# Patient Record
Sex: Male | Born: 1990 | Race: White | Hispanic: No | Marital: Single | Smoking: Never smoker
Health system: Southern US, Community
[De-identification: ages and names within clinical notes are randomized; demographics above are authoritative.]

## PROBLEM LIST (undated history)

## (undated) DIAGNOSIS — L309 Dermatitis, unspecified: Secondary | ICD-10-CM

## (undated) DIAGNOSIS — F909 Attention-deficit hyperactivity disorder, unspecified type: Secondary | ICD-10-CM

## (undated) DIAGNOSIS — M7030 Other bursitis of elbow, unspecified elbow: Secondary | ICD-10-CM

## (undated) HISTORY — DX: Dermatitis, unspecified: L30.9

## (undated) HISTORY — PX: WRIST SURGERY: SHX841

## (undated) HISTORY — PX: TONSILLECTOMY: SUR1361

## (undated) HISTORY — PX: WISDOM TOOTH EXTRACTION: SHX21

---
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
## 2009-05-01 ENCOUNTER — Emergency Department (HOSPITAL_BASED_OUTPATIENT_CLINIC_OR_DEPARTMENT_OTHER): Admission: EM | Admit: 2009-05-01 | Discharge: 2009-05-01 | Payer: Self-pay | Admitting: Emergency Medicine

## 2010-02-19 ENCOUNTER — Ambulatory Visit: Payer: Self-pay | Admitting: Family Medicine

## 2010-02-19 ENCOUNTER — Inpatient Hospital Stay (HOSPITAL_COMMUNITY): Admission: EM | Admit: 2010-02-19 | Discharge: 2010-02-24 | Payer: Self-pay | Admitting: Emergency Medicine

## 2010-02-19 DIAGNOSIS — M79609 Pain in unspecified limb: Secondary | ICD-10-CM | POA: Insufficient documentation

## 2010-07-29 NOTE — Assessment & Plan Note (Signed)
Summary: RIGHT HAND PAIN (3)   Vital Signs:  Patient Profile:   20 Years Old Male CC:      Right hand pain and swelling intermittently x 2 months Height:     74 inches Weight:      152 pounds O2 Sat:      97 % O2 treatment:    Room Air Temp:     97.7 degrees F oral Pulse rate:   59 / minute Resp:     16 per minute BP sitting:   133 / 87  (left arm) Cuff size:   regular  Pt. in pain?   yes    Location:   right hand    Intensity:   9    Type:       aching  Vitals Entered By: Lajean Saver RN (February 19, 2010 8:50 AM)                   Updated Prior Medication List: No Medications Current Allergies: No known allergies History of Present Illness Chief Complaint: Right hand pain and swelling intermittently x 2 months History of Present Illness: 20 yo M here for right 3rd finger pain and swelling.  Patient states this started about 2-3 months ago.  Thinks something punctured palmar aspect of hand around that time but didn't think much of this.  Intermittently has had pain and swelling - saw Dr. Melvyn Novas, had negative x-rays and given a cortisone shot palmar hand 2 weeks ago.  Over past several days has had extreme pain that woke him up this morning, swelling, and minimal movement of 3rd finger.  No fevers.  Had redness in hand that has since gone away.  No new injuries.  Feels like getting pain up into his elbow and shoulder now too.  Was offered appt this afternoon but stated he couldn't wait that long so came in here.  REVIEW OF SYSTEMS Constitutional Symptoms      Denies fever, chills, night sweats, weight loss, weight gain, and fatigue.  Eyes       Denies change in vision, eye pain, eye discharge, glasses, contact lenses, and eye surgery. Ear/Nose/Throat/Mouth       Denies hearing loss/aids, change in hearing, ear pain, ear discharge, dizziness, frequent runny nose, frequent nose bleeds, sinus problems, sore throat, hoarseness, and tooth pain or bleeding.  Respiratory  Denies dry cough, productive cough, wheezing, shortness of breath, asthma, bronchitis, and emphysema/COPD.  Cardiovascular       Denies murmurs, chest pain, and tires easily with exhertion.    Gastrointestinal       Denies stomach pain, nausea/vomiting, diarrhea, constipation, blood in bowel movements, and indigestion. Genitourniary       Denies painful urination, kidney stones, and loss of urinary control. Neurological       Complains of numbness and tingling.      Denies paralysis, seizures, and fainting/blackouts.      Comments: right hand and wrist Musculoskeletal       Complains of muscle pain, joint pain, and redness.      Denies joint stiffness, decreased range of motion, swelling, muscle weakness, and gout.      Comments: right hand radiating up arm Skin       Denies bruising, unusual mles/lumps or sores, and hair/skin or nail changes.  Psych       Denies mood changes, temper/anger issues, anxiety/stress, speech problems, depression, and sleep problems. Other Comments: Patient c/o right hand swelling every 2 days x  2-3 months. He has been seen by Dr. Orlan Leavens who gave him a cortisone injection and was unsure of the cause of the swelling. He c/o of severe pain, tingling in his wrist, and numbness in his hand.    Past History:  Past Medical History: Multiple broken bones Possible break in the past to right knuckles  Past Surgical History: left wrist repair Tonsillectomy  Family History: none  Social History: Never Smoked Alcohol use-no Drug use-no Regular exercise-yes Smoking Status:  never Drug Use:  no Does Patient Exercise:  yes Physical Exam General appearance: well developed, well nourished, no acute distress Extremities: R hand: obvious swelling in 3rd digit extending into distal palm and MCP joint.  TTP from distal palm through 3rd finger especially palmar aspect.  Pain with passive extension of finger reproducing his pain.  No obvious cellulitis.  Minimal active  ROM. Cap refill < 3 secs distally. Assessment New Problems: HAND PAIN, RIGHT (ICD-729.5)  flexor tenosynovitis, concerning for infectious etiology with h/o cortisone shot and prior puncture wound  Plan New Orders: T-CBC w/Diff [[id number]] New Patient Level III [99203] Planning Comments:   check cbc with diff - if wbc count elevated or left shift would help to confirm infectious etiology though may be falsely normal with contained tenosynovitis.  IM toradol for pain relief.  Appointment this afternoon with [facility] for further evaluation.  Discussed they may repeat imaging, consider other imaging or go ahead with exploratory surgery given his history and exam findings.  Addendum: CBC reviewed - normal WBC count and no left shift.  Appointment made for this afternoon at 2:30pm with Dr. Orlan Leavens.   The patient and/or caregiver has been counseled thoroughly with regard to medications prescribed including dosage, schedule, interactions, rationale for use, and possible side effects and they verbalize understanding.  Diagnoses and expected course of recovery discussed and will return if not improved as expected or if the condition worsens. Patient and/or caregiver verbalized understanding.   Medication Administration  Injection # 1:    Medication: Ketorolac-Toradol 15mg     Diagnosis: HAND PAIN, RIGHT (ICD-729.5)    Route: IM    Lot #: [id number]    Mfr: Novaplus    Comments: 60mg  given    Patient tolerated injection without complications    Given by: Lajean Saver RN (February 19, 2010 9:30 AM)  Orders Added: 1)  T-CBC w/Diff [[id number]] 2)  New Patient Level III [99203]

## 2010-09-11 LAB — FUNGUS CULTURE W SMEAR: Fungal Smear: NONE SEEN

## 2010-09-11 LAB — SEDIMENTATION RATE: Sed Rate: 7 mm/hr (ref 0–16)

## 2010-09-11 LAB — GRAM STAIN

## 2010-09-11 LAB — DIFFERENTIAL
Lymphocytes Relative: 34 % (ref 12–46)
Lymphs Abs: 2.7 10*3/uL (ref 0.7–4.0)
Monocytes Relative: 10 % (ref 3–12)
Neutrophils Relative %: 54 % (ref 43–77)

## 2010-09-11 LAB — ANAEROBIC CULTURE

## 2010-09-11 LAB — CBC
Hemoglobin: 14 g/dL (ref 13.0–17.0)
Platelets: 154 10*3/uL (ref 150–400)
RBC: 4.6 MIL/uL (ref 4.22–5.81)
WBC: 7.8 10*3/uL (ref 4.0–10.5)

## 2010-09-11 LAB — CULTURE, ROUTINE-ABSCESS

## 2010-09-11 LAB — C-REACTIVE PROTEIN: CRP: 2.6 mg/dL — ABNORMAL HIGH (ref <0.6)

## 2010-11-14 NOTE — Consult Note (Signed)
[name], [name] NO.:  [id number]   MEDICAL RECORD NO.:  [id number]          PATIENT TYPE:  EMS   LOCATION:  MAJO                         FACILITY:  [facility]   PHYSICIAN:  Sandria Bales. Ezzard Standing, M.D.  DATE OF BIRTH:  12-21-1990   DATE OF CONSULTATION:  02/16/2006  DATE OF DISCHARGE:                                   CONSULTATION   HISTORY OF PRESENT ILLNESS:  This is a 21 year old male who went through  10th grade, is finishing his graduate education degree, he is planning on  this November joining the [employer].  This past Friday, August 17, he was  doing a forward flip when he did not quite make the flip correctly.  He  ended up with some right hip pain, went to an Urgent Care Center on  Saturday, August 18, came to the [facility] Emergency Room the evening of  August 18 because of worsening pain, was found to have a hematoma inside his  right iliac bone on CT scan.  The patient was sent home, still having pain,  was finally given Flexeril.  He came back Sunday evening, August 19, with  pain.  Another CT was performed which showed no change in his hematoma.  He  was sent home again.  He came back to the ER today, again, with more pain.   This time an MRI was obtained by Dr. Gray Bernhardt, who talked to me about  the patient.  In reviewing the films with Irish Lack, this hematoma runs  inside his right iliac bone, it is about 10 cm in length, and about 8 by 5.5  cm, a little bit bigger, about the same size it was when he first had CT, as  best as can be measured.  Dr. Fredia Sorrow is concerned that he may have an  iliopsoas tendon injury.  There is very little liquid component to this  hematoma, there is really nothing to evacuate.   The patient has no allergies.  He has been given Percocet, Flexeril, and  ibuprofen, for the pain and put ice packs on it.   PAST MEDICAL HISTORY:  He has no significant pulmonary, cardiac,  gastrointestinal, or urological problems.   He is here with his mother who is  at his bedside.   PHYSICAL EXAMINATION:  VITAL SIGNS:  Temperature 97.2, blood pressure 126/71, pulse 81.  GENERAL:  He is a well nourished white male, alert and cooperative.  LUNGS:  Clear to auscultation.  HEART:  Regular rate and rhythm.  ABDOMEN:  He is tender in the right lower quadrant.  He keeps his right leg  flexed at about 30 degrees.  He can straighten his leg out but it hurts a  lot.  He really has very little external evidence of an injury.   IMPRESSION:  Right iliopsoas muscle or tendon tear with surrounding  hematoma.  I do not think there is a reason to admit him to the hospital.  This is going to take a while to get over.  He is already on the correct  medicines.  I told his  mother that ibuprofen is a platelet inhibitor and I  worry about him continuing bleeding on the ibuprofen.   They have an orthopedic doctor because he has already had multiple  orthopedic injuries in the past and I suggested she see an orthopedic doctor  within a week.  He may need some rehab because he does have a little bit of  numbness over the anterior aspect of his thigh and he probably has some  possible nerve injury from stretch.  I would be very hesitant, however, to  do any kind of mechanical manipulation of his hematoma, either instrumenting  or putting needles in it for fear of causing infection.  I think they  understand this well.   Dr. Effie Shy will give her some more Flexeril, make sure she has plenty of  Percocet, they will use ice packs, he has Colace for his stools.  He knows  to drink plenty of liquids and follow up with his orthopedic doctor.      Sandria Bales. Ezzard Standing, M.D.  Electronically Signed     DHN/MEDQ  D:  02/16/2006  T:  02/16/2006  Job:  [id number]

## 2010-11-14 NOTE — Op Note (Signed)
   NAME:  [name], [name]                       ACCOUNT NO.:  [id number]   MEDICAL RECORD NO.:  [id number]                   PATIENT TYPE:  EMS   LOCATION:  MINO                                 FACILITY:  [facility]   PHYSICIAN:  Burnard Bunting, M.D.                 DATE OF BIRTH:  June 08, 1991   DATE OF PROCEDURE:  DATE OF DISCHARGE:                                 OPERATIVE REPORT   PREOPERATIVE DIAGNOSIS:  Left distal radial fracture separation.   POSTOPERATIVE DIAGNOSIS:  Left distal radial fracture separation.   OPERATION/PROCEDURE:  Left distal radius closed reduction percutaneous  pinning.   ANESTHESIA:  General endotracheal.   ESTIMATED BLOOD LOSS:  None.   DESCRIPTION OF PROCEDURE:  The patient was brought to the operating room  where a general endotracheal anesthesia was induced.  Perioperative  antibiotics were administered.  The left wrist, while under mutation was  paralyzed and the muscle relaxant at the left wrist was reduced one time.  Anatomic reduction was achieved.  The arm was then prepped with sterile prep  solution and draped in a sterile manner.  A single 0.45 K-wire was placed  through the radial styloid across the symphysis into the shaft of the distal  radius, achieving bicortical fixation just as only one reduction was  utilized for the fracture reduction, only one pass of the pin was utilized  to secure the apophysis to the symphysis.  A 0.45 K-wire was utilized.  An  11 blade was used to make a small incision which and blunt tissue dissection  was then performed down to the bone.  The reduction was confirmed in the AP  and lateral planes.  The pin was bent.  Bactroban cream was applied to the  incision site.  One 3-0 nylon suture was placed prior to applying the  Bactroban and the Xeroform.  A bulky [place] splint was applied.  The  patient tolerated the procedure well without immediate complications.  The  patient was transferred to the recovery room in  stable condition.                                                 Burnard Bunting, M.D.    GSD/MEDQ  D:  05/06/2002  T:  05/07/2002  Job:  [id number]

## 2010-11-14 NOTE — H&P (Signed)
   NAMELORENZO, [name]                       ACCOUNT NO.:  [id number]   MEDICAL RECORD NO.:  [id number]                   PATIENT TYPE:  INP   LOCATION:  1824                                 FACILITY:  [facility]   PHYSICIAN:  Burnard Bunting, M.D.                 DATE OF BIRTH:  03-14-1991   DATE OF ADMISSION:  05/06/2002  DATE OF DISCHARGE:  05/06/2002                                HISTORY & PHYSICAL   CHIEF COMPLAINT:  Left wrist pain.   HISTORY OF PRESENT ILLNESS:  The patient is a 20 year old right hand  dominant patient who fell at [facility] today.  He landed on his left  wrist.  He denies any other orthopedic complaints, denies any loss of  consciousness.   ALLERGIES:  No known drug allergies.   MEDICATIONS:  No current medications.   PAST MEDICAL HISTORY:  No real past medical history except for some  occasional exertional asthma.   FAMILY HISTORY:  Noncontributory.   PHYSICAL EXAMINATION:  GENERAL:  He is alert and oriented x3.  He is in mild  distress.  CHEST:  Clear to auscultation.  HEART:  Regular rate and rhythm.  ABDOMEN:  Exam is benign.  NECK:  There is no lymphadenopathy.  EXTREMITIES:  Extremities demonstrate a left wrist deformity with palmar  paresthesias, decreased range of motion of all fingers.  Capillary refill is  about two seconds.   LABORATORY DATA:  X-rays show Salter 2 fracture, dislocation of the left  wrist.  There is a dorsal displacement of the epiphysis.   IMPRESSION:  Left wrist epiphyseal separation.   PLAN:  Closed reduction, percutaneous pinning.  Risks and benefits of the  procedure are discussed.  Risks include infection and swelling.  There is  the possibility of growth plate disturbance from this injury.  That would  become apparent 1-2 years down the road.  He will be in an cast for six  weeks.  All questions are answered.  We will proceed with the surgery today.                                               Burnard Bunting, M.D.    GSD/MEDQ  D:  05/06/2002  T:  05/07/2002  Job:  [id number]

## 2013-03-01 ENCOUNTER — Encounter: Payer: Self-pay | Admitting: *Deleted

## 2013-03-01 ENCOUNTER — Emergency Department
Admission: EM | Admit: 2013-03-01 | Discharge: 2013-03-01 | Disposition: A | Discharge status: Home / Self Care | Payer: Self-pay | Source: Home / Self Care | Attending: Family Medicine | Admitting: Family Medicine

## 2013-03-01 DIAGNOSIS — R21 Rash and other nonspecific skin eruption: Secondary | ICD-10-CM

## 2013-03-01 DIAGNOSIS — L0100 Impetigo, unspecified: Secondary | ICD-10-CM

## 2013-03-01 MED ORDER — DOMEBORO 25 % EX PACK: PACK | CUTANEOUS | Status: DC

## 2013-03-01 MED ORDER — HYDROCODONE-ACETAMINOPHEN 5-325 MG PO TABS: ORAL_TABLET | ORAL | Status: DC

## 2013-03-01 MED ORDER — CEPHALEXIN 500 MG PO CAPS: 500.0000 mg | ORAL_CAPSULE | Freq: Three times a day (TID) | ORAL | Status: DC

## 2013-03-01 NOTE — ED Provider Notes (Signed)
CSN: [id number]     Arrival date & time 03/01/13  1114 History   First MD Initiated Contact with Patient 03/01/13 1126     Chief Complaint  Patient presents with  . Rash  . Diarrhea      HPI Comments: Patient reports that he had a poison ivy rash about two weeks ago that resolved with a steroid shot and prednisone. Four days ago he developed a small fissure behind his right ear, which he has had in the past.  This was followed by a rash beneath the fissure and soreness in right neck nodes.  He also had chills and fatigue.  He has now developed a small fissure behind his left ear that has not been painful.  He notes that he has had loose stools for 2 days, and an episode of nausea/vomiting yesterday.  Patient is a 22 y.o. male presenting with rash. The history is provided by the patient.  Rash Pain location: behind and around right ear. Pain quality: aching   Pain radiates to:  Does not radiate Pain severity:  Mild Onset quality:  Gradual Duration:  4 days Timing:  Constant Progression:  Worsening Chronicity:  New Relieved by:  Nothing Worsened by:  Nothing tried Ineffective treatments: steroid cream. Associated symptoms: chills, diarrhea, nausea and vomiting   Associated symptoms: no sore throat     History reviewed. No pertinent past medical history. History reviewed. No pertinent past surgical history. History reviewed. No pertinent family history. History  Substance Use Topics  . Smoking status: Never Smoker   . Smokeless tobacco: Current User    Types: Snuff  . Alcohol Use: Yes    Review of Systems  Constitutional: Positive for chills.  HENT: Negative for sore throat.   Gastrointestinal: Positive for nausea, vomiting and diarrhea.  Skin: Positive for rash.  All other systems reviewed and are negative.    Allergies  Poison ivy extract  Home Medications   Current Outpatient Rx  Name  Route  Sig  Dispense  Refill  . Alum Sulfate-Ca Acetate (DOMEBORO) 25 %  PACK      Dissolve in water and apply as a compress TID   12 each   1   . cephALEXin (KEFLEX) 500 MG capsule   Oral   Take 1 capsule (500 mg total) by mouth 3 (three) times daily.   30 capsule   0   . HYDROcodone-acetaminophen (NORCO/VICODIN) 5-325 MG per tablet      Take one by mouth at bedtime as needed for pain   8 tablet   0    BP 147/81  Pulse 56  Temp(Src) 97.9 F (36.6 C) (Oral)  Resp 14  Ht 6\' 1"  (1.854 m)  Wt 164 lb (74.39 kg)  BMI 21.64 kg/m2  SpO2 99% Physical Exam  Nursing note and vitals reviewed. Constitutional: He is oriented to person, place, and time. He appears well-developed and well-nourished. No distress.  HENT:  Head: Normocephalic.    Nose: Nose normal.  Mouth/Throat: Oropharynx is clear and moist.  Behind the right ear is a 1cm by 3cm moist excoriation with honey colored crust.  Beneath the right ear are a number of small 1 to 3mm satellite erythematous lesions.  No swelling or tenderness. There is a tender shotty right post-auricular node present. Behind the left ear is a minimal 1cm long shallow fissure without tenderness, drainage, or swelling.  Eyes: Conjunctivae are normal. Pupils are equal, round, and reactive to light.  Neck: Neck supple.  Cardiovascular: Normal heart sounds.   Pulmonary/Chest: Breath sounds normal.  Lymphadenopathy:    He has cervical adenopathy.  Neurological: He is alert and oriented to person, place, and time.  Skin: Skin is warm and dry.    ED Course  Procedures  none    Labs Reviewed  WOUND CULTURE       MDM   1. Rash and nonspecific skin eruption; lesions right face are somewhat suggestive of herpes zoster, but probably represent bacterial infection   2. Impetigo    Wound culture from behind right ear pending. Begin Keflex.  [place]boro compresses.  Lortab at bedtime. Followup with dermatologist if not improving.    Lattie Haw, MD 03/02/13 [phone]

## 2013-03-01 NOTE — ED Notes (Addendum)
[name] presents with rash behind both ears, more pronounced on the right side x 4 days. 2 days ago, he reports having a HA on the right side and swollen lymph nodes on the right. He vomited that day. For the past 2 days diarrhea and fatigue. About 2 weeks ago he has a widespread poison ivy, he tried putting this cream on the rash, reports it made it worse.

## 2013-03-03 ENCOUNTER — Telehealth: Payer: Self-pay | Admitting: Emergency Medicine

## 2013-03-04 ENCOUNTER — Telehealth: Payer: Self-pay | Admitting: Emergency Medicine

## 2013-03-04 LAB — WOUND CULTURE
Gram Stain: NONE SEEN
Gram Stain: NONE SEEN

## 2013-03-06 ENCOUNTER — Telehealth: Payer: Self-pay | Admitting: Emergency Medicine

## 2013-03-07 ENCOUNTER — Telehealth: Payer: Self-pay | Admitting: *Deleted

## 2015-06-13 ENCOUNTER — Other Ambulatory Visit: Payer: Self-pay | Admitting: Occupational Medicine

## 2015-06-13 ENCOUNTER — Ambulatory Visit
Admission: RE | Admit: 2015-06-13 | Discharge: 2015-06-13 | Disposition: A | Discharge status: Home / Self Care | Payer: No Typology Code available for payment source | Source: Ambulatory Visit | Attending: Occupational Medicine | Admitting: Occupational Medicine

## 2015-06-13 DIAGNOSIS — Z021 Encounter for pre-employment examination: Secondary | ICD-10-CM

## 2015-08-14 ENCOUNTER — Ambulatory Visit
Admission: RE | Admit: 2015-08-14 | Discharge: 2015-08-14 | Disposition: A | Discharge status: Home / Self Care | Payer: Worker's Compensation | Source: Ambulatory Visit | Attending: Nurse Practitioner | Admitting: Nurse Practitioner

## 2015-08-14 ENCOUNTER — Other Ambulatory Visit: Payer: Self-pay | Admitting: Nurse Practitioner

## 2015-08-14 DIAGNOSIS — M79671 Pain in right foot: Secondary | ICD-10-CM

## 2016-06-18 ENCOUNTER — Other Ambulatory Visit: Payer: Self-pay | Admitting: Nurse Practitioner

## 2016-06-18 ENCOUNTER — Ambulatory Visit
Admission: RE | Admit: 2016-06-18 | Discharge: 2016-06-18 | Disposition: A | Discharge status: Home / Self Care | Payer: Worker's Compensation | Source: Ambulatory Visit | Attending: Nurse Practitioner | Admitting: Nurse Practitioner

## 2016-06-18 DIAGNOSIS — M25512 Pain in left shoulder: Secondary | ICD-10-CM

## 2016-11-21 IMAGING — CR DG OS CALCIS 2+V*R*
2 series · 2 of 2 positions shown · non-contrast
Comparison: None in PACs

CLINICAL DATA: Five-day history of right heel pain radiating into
the arch of the foot. Symptoms are worse after exercise and upon
arising in the morning ; probable plantar fasciitis.

EXAM:
RIGHT OS CALCIS - 2+ VIEW

[x calcaneus axial right]
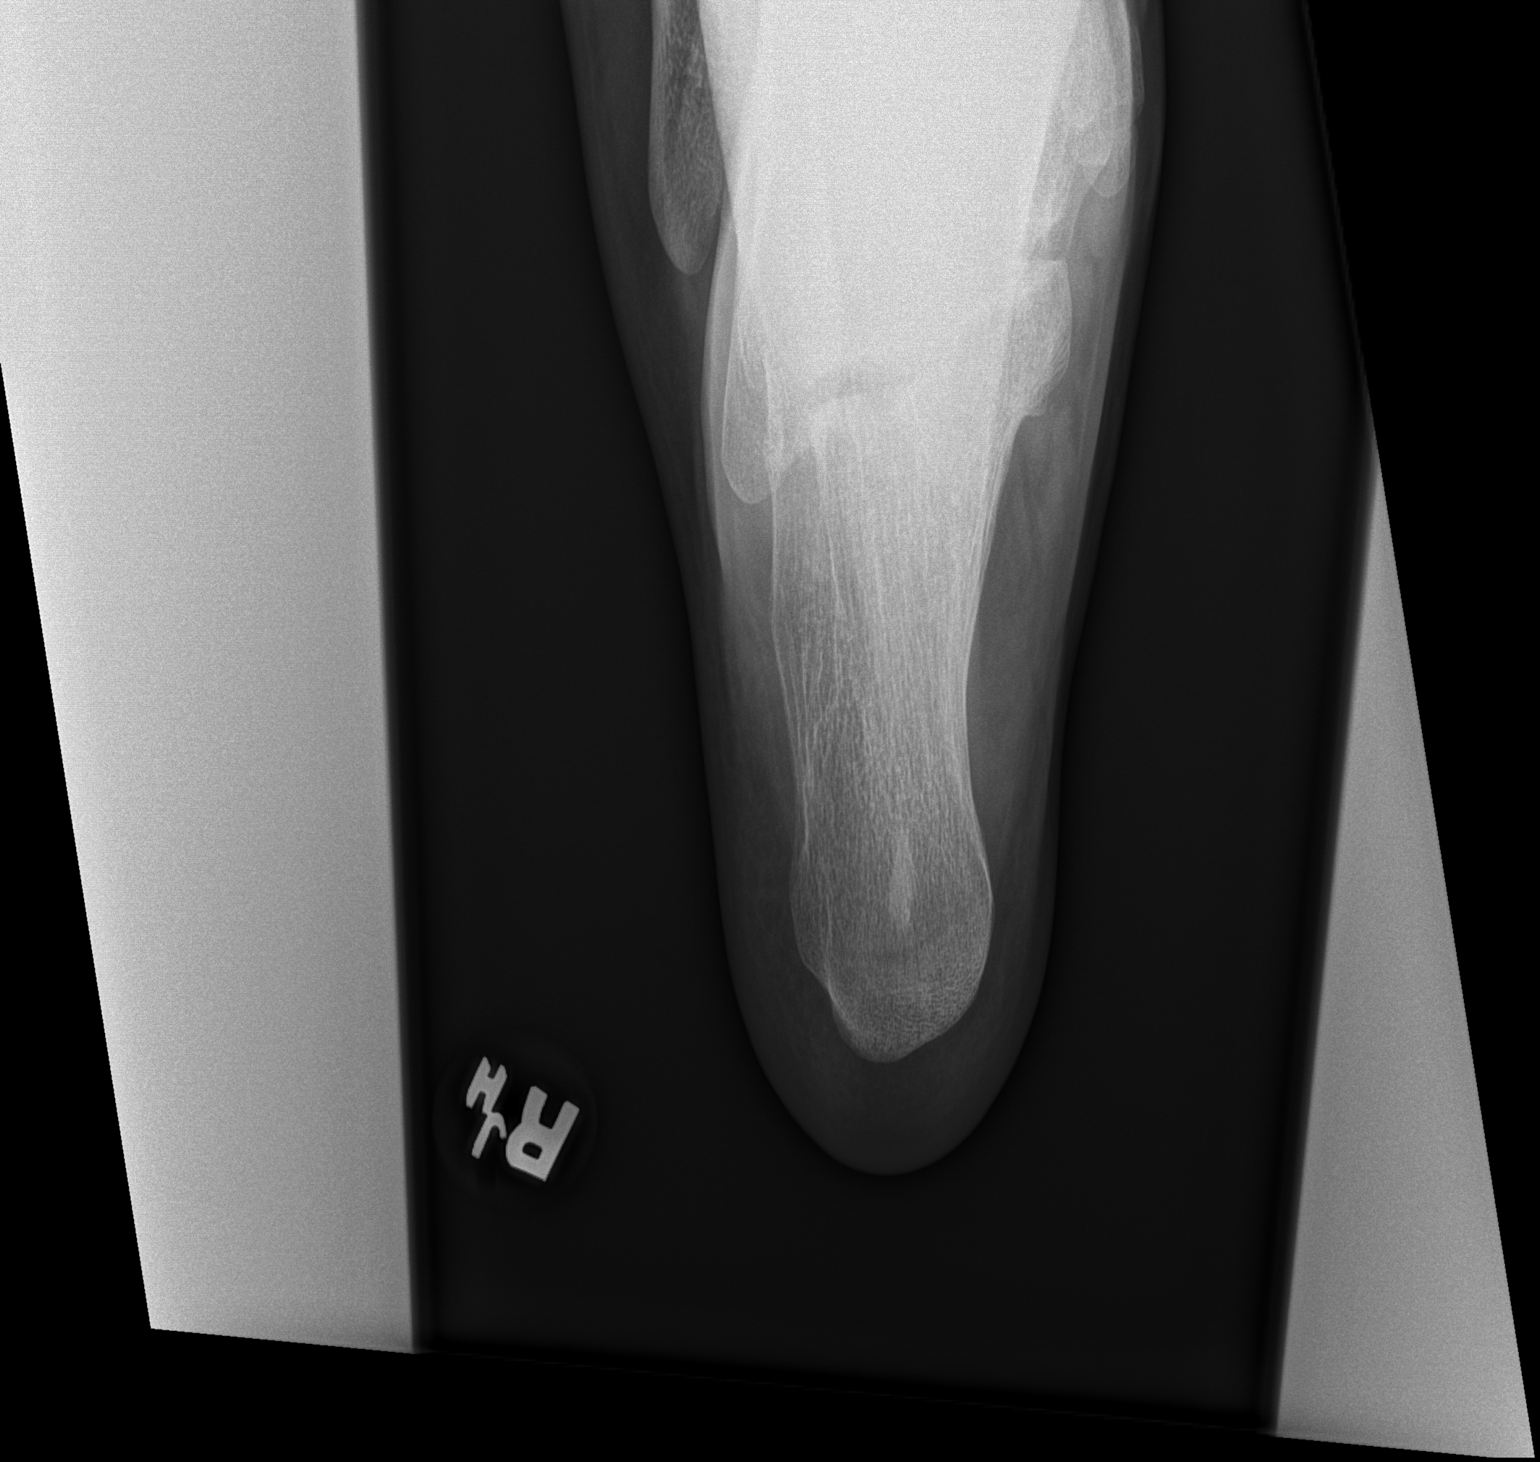

[x calcaneus lat right]
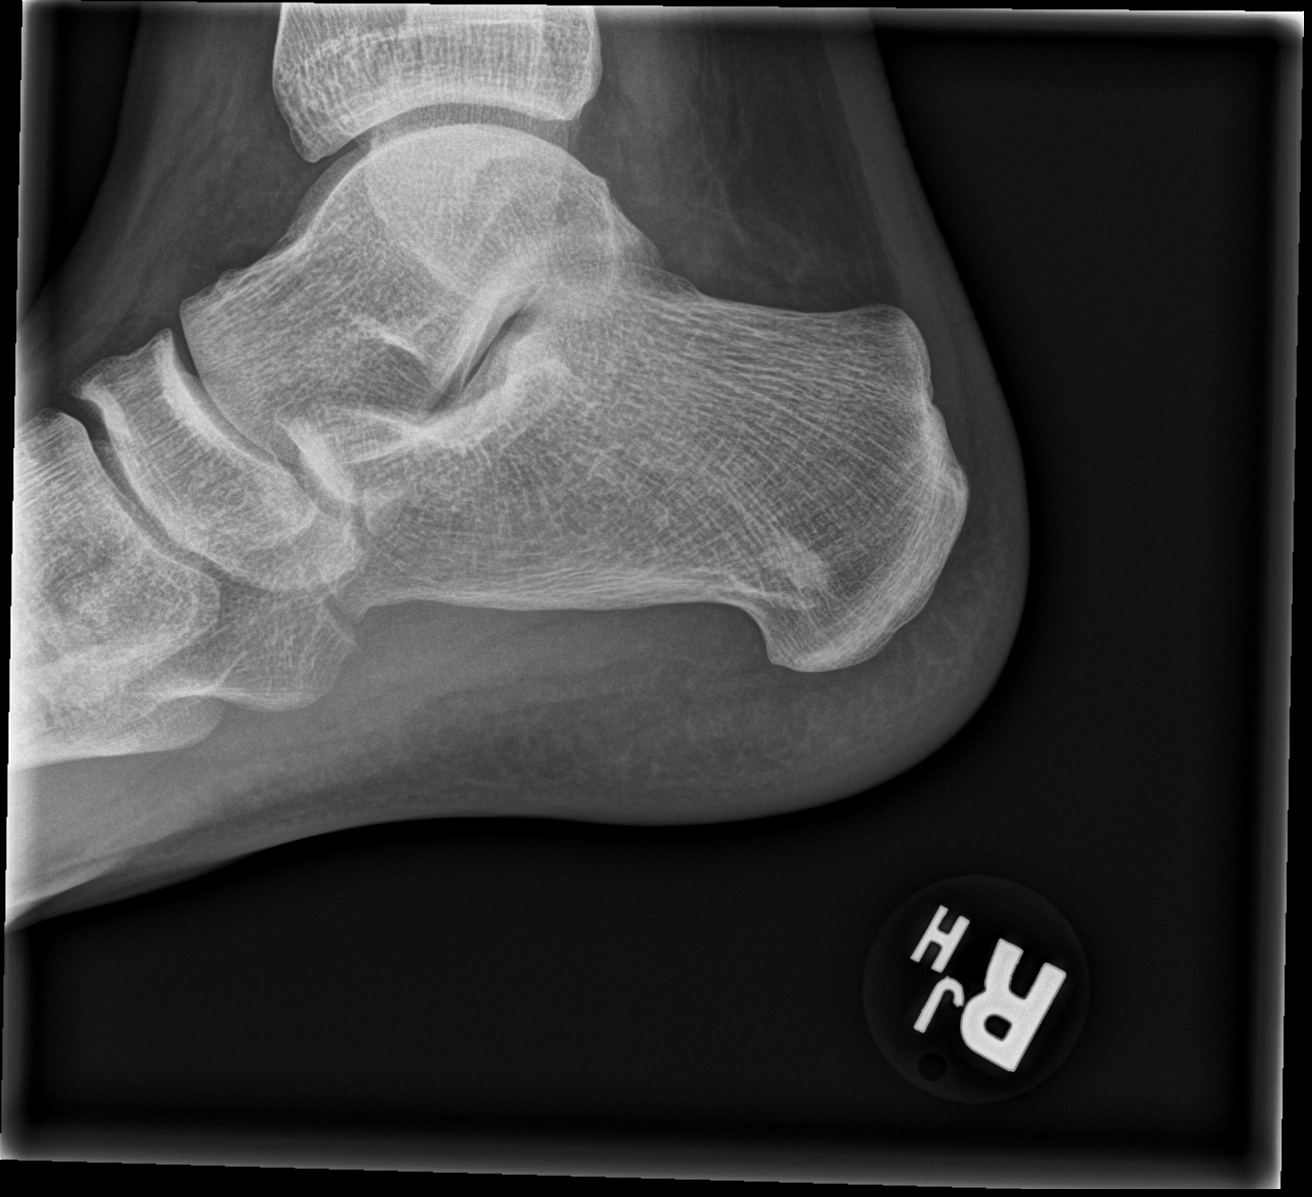

[2 of 2 positions shown; findings below may reference images not displayed]

FINDINGS: The calcaneus appears normal in contour. There is no acute or
healing fracture. There is no lytic lesion. A sclerotic focus
posteriorly and inferiorly is most compatible with a bone island.
There is no significant calcaneal spurring. The talus and other
visualized hindfoot bones appear normal. The soft tissues are
unremarkable.
IMPRESSION: There is no acute bony abnormality of the right calcaneus. No
significant spurring is observed.

## 2017-07-15 ENCOUNTER — Encounter: Payer: Self-pay | Admitting: Allergy and Immunology

## 2017-07-15 ENCOUNTER — Ambulatory Visit: Payer: 59 | Admitting: Allergy and Immunology

## 2017-07-15 VITALS — BP 140/70 | HR 98 | Temp 98.2°F | Resp 18 | Ht 71.1 in | Wt 164.8 lb

## 2017-07-15 DIAGNOSIS — L209 Atopic dermatitis, unspecified: Secondary | ICD-10-CM | POA: Insufficient documentation

## 2017-07-15 DIAGNOSIS — J310 Chronic rhinitis: Secondary | ICD-10-CM | POA: Diagnosis not present

## 2017-07-15 DIAGNOSIS — L2089 Other atopic dermatitis: Secondary | ICD-10-CM | POA: Diagnosis not present

## 2017-07-15 MED ORDER — MOMETASONE FUROATE 0.1 % EX OINT: TOPICAL_OINTMENT | CUTANEOUS | 3 refills | Status: DC

## 2017-07-15 MED ORDER — CRISABOROLE 2 % EX OINT: 1.0000 "application " | TOPICAL_OINTMENT | Freq: Two times a day (BID) | CUTANEOUS | 5 refills | Status: DC | PRN

## 2017-07-15 MED ORDER — FLUTICASONE PROPIONATE 50 MCG/ACT NA SUSP: 1.0000 | Freq: Every day | NASAL | 5 refills | Status: DC

## 2017-07-15 MED ORDER — DESONIDE 0.05 % EX OINT: TOPICAL_OINTMENT | CUTANEOUS | 3 refills | Status: AC

## 2017-07-15 NOTE — Patient Instructions (Addendum)
Atopic dermatitis  Appropriate skin care recommendations have been provided verbally and in written form.  Prescription has been provided for Eucrisa (crisaborole) 2% ointment twice a day to affected areas as needed.  A prescription has been provided for desonide 0.05% ointment sparingly to affected areas twice daily as needed to the face and/or neck. Care is to be taken to avoid the eyes.  A prescription has been provided for mometasone 0.1% ointment sparingly to affected areas daily as needed below the face and neck. Care is to be taken to avoid the axillae and groin area.  The patient has been asked to make note of any foods that trigger symptom flares.  Fingernails are to be kept trimmed.  Information has been provided regarding CLn BodyWash to reduce staph aureus colonization.  CLn BodyWash is ordered online however, if it is too expensive, information and instructions for diluted bleach baths have also been provided.  Information has been provided and discussed for Dupixent. The patient is interested in initiating this therapy. Paperwork will be submitted to insurance and a sample will be provided.  Chronic rhinitis All seasonal and perennial aeroallergen skin tests are negative despite a positive histamine control.  Intranasal steroids, intranasal antihistamines, and first generation antihistamines are effective for symptoms associated with non-allergic rhinitis, whereas second generation antihistamines such as cetirizine (Zyrtec), loratadine (Claritin) and fexofenadine (Allegra) have been found to be ineffective for this condition.  A prescription has been provided for fluticasone nasal spray, one spray per nostril 1-2 times daily as needed. Proper nasal spray technique has been discussed and demonstrated.  Nasal saline lavage (NeilMed) has been recommended as needed and prior to medicated nasal sprays along with instructions for proper administration.  For thick post nasal drainage,  add guaifenesin 1200 mg (Mucinex Maximum Strength)  twice daily as needed with adequate hydration as discussed.   Return in about 3 months (around 10/13/2017), or if symptoms worsen or fail to improve.  ECZEMA SKIN CARE REGIMEN:  Bathed and soak for 10 minutes in warm water once today. Pat dry.  Immediately apply the below creams: To healthy skin apply Aquaphor or Vaseline jelly twice a day. To affected areas on the face and neck, apply: . Eucrisa (crisaborole) 2% ointment twice a day to affected areas as needed for mild areas. . Desonide 0.05% ointment twice a day as needed for stubborn areas. . Be careful to avoid the eyes. To affected areas on the body (below the face and neck), apply: . Eucrisa (crisaborole) 2% ointment twice a day to affected areas as needed for mild areas. . Mometasone 0.1% ointment once a day as needed for stubborn areas. . With ointments be careful to avoid the armpits and groin area. Note of any foods make the eczema worse. Keep finger nails trimmed and filed.  CLn BodyWash may be ordered online at [email or web address]  If CLn BodyWash is too expensive, may try diluted bleach baths...  Diluted bleach bath recipe and instructions:   Add  -  cup of common household bleach to a bathtub full of water.  Soak the affected part of the body (below the head and neck) for about 10 minutes.  Limit diluted bleach baths to no more than twice a week.   Do not submerge the head or face and be very careful to avoid getting the diluted bleach into the eyes.   Rinse off with fresh water and apply moisturizer.

## 2017-07-15 NOTE — Assessment & Plan Note (Signed)
All seasonal and perennial aeroallergen skin tests are negative despite a positive histamine control.  Intranasal steroids, intranasal antihistamines, and first generation antihistamines are effective for symptoms associated with non-allergic rhinitis, whereas second generation antihistamines such as cetirizine (Zyrtec), loratadine (Claritin) and fexofenadine (Allegra) have been found to be ineffective for this condition.  A prescription has been provided for fluticasone nasal spray, one spray per nostril 1-2 times daily as needed. Proper nasal spray technique has been discussed and demonstrated.  Nasal saline lavage (NeilMed) has been recommended as needed and prior to medicated nasal sprays along with instructions for proper administration.  For thick post nasal drainage, add guaifenesin 1200 mg (Mucinex Maximum Strength)  twice daily as needed with adequate hydration as discussed.

## 2017-07-15 NOTE — Progress Notes (Signed)
New Patient Note  RE: [name] MRN: [id number] DOB: January 13, 1991 Date of Office Visit: 07/15/2017  Referring provider: No ref. provider found Primary care provider: Patient, No Pcp Per  Chief Complaint: Eczema   History of present illness: [name] is a 27 y.o. male presenting today for evaluation of eczema. He reports that he has had eczema since infancy.  The eczema primarily involves his popliteal fossae, buttocks, antecubital fossae, wrists, chest, back, neck, face, and eyelids.  No specific food or environmental triggers have been identified which seem to correlate with eczema flares.  However, he does note that he eats soy based products frequently and is curious if this food may be triggering his eczema flares.  He has seen a dermatologist in the past with prescriptions for fluocinonide and fluticasone cream, but has had difficulty getting refill prescriptions, therefore he has decided to seek allergy evaluation.  He experiences nasal congestion and thick postnasal drainage, particularly in cold weather.  No other specific triggers have been identified related to his nasal symptoms other than upper respiratory tract infections.   Assessment and plan: Atopic dermatitis  Appropriate skin care recommendations have been provided verbally and in written form.  Prescription has been provided for Eucrisa (crisaborole) 2% ointment twice a day to affected areas as needed.  A prescription has been provided for desonide 0.05% ointment sparingly to affected areas twice daily as needed to the face and/or neck. Care is to be taken to avoid the eyes.  A prescription has been provided for mometasone 0.1% ointment sparingly to affected areas daily as needed below the face and neck. Care is to be taken to avoid the axillae and groin area.  The patient has been asked to make note of any foods that trigger symptom flares.  Fingernails are to be kept trimmed.  Information has been  provided regarding CLn BodyWash to reduce staph aureus colonization.  CLn BodyWash is ordered online however, if it is too expensive, information and instructions for diluted bleach baths have also been provided.  Information has been provided and discussed for Dupixent. The patient is interested in initiating this therapy. Paperwork will be submitted to insurance and a sample will be provided.  Chronic rhinitis All seasonal and perennial aeroallergen skin tests are negative despite a positive histamine control.  Intranasal steroids, intranasal antihistamines, and first generation antihistamines are effective for symptoms associated with non-allergic rhinitis, whereas second generation antihistamines such as cetirizine (Zyrtec), loratadine (Claritin) and fexofenadine (Allegra) have been found to be ineffective for this condition.  A prescription has been provided for fluticasone nasal spray, one spray per nostril 1-2 times daily as needed. Proper nasal spray technique has been discussed and demonstrated.  Nasal saline lavage (NeilMed) has been recommended as needed and prior to medicated nasal sprays along with instructions for proper administration.  For thick post nasal drainage, add guaifenesin 1200 mg (Mucinex Maximum Strength)  twice daily as needed with adequate hydration as discussed.   Meds ordered this encounter  Medications  . Crisaborole (EUCRISA) 2 % OINT    Sig: Apply 1 application topically 2 (two) times daily as needed.    Dispense:  60 g    Refill:  5  . desonide (DESOWEN) 0.05 % ointment    Sig: Apply to affected areas twice a day as needed to the face and neck avoiding the eyes    Dispense:  60 g    Refill:  3  . mometasone (ELOCON) 0.1 % ointment  Sig: Apply to affected areas daily as needed below the neck avoiding th under arms and groin    Dispense:  45 g    Refill:  3  . fluticasone (FLONASE) 50 MCG/ACT nasal spray    Sig: Place 1 spray into both nostrils daily.      Dispense:  18.2 g    Refill:  5    Diagnostics: Environmental skin testing: Negative despite a positive histamine control. Food allergen skin testing: Negative despite a positive histamine control.    Physical examination: Blood pressure 140/70, pulse 98, temperature 98.2 F (36.8 C), temperature source Oral, resp. rate 18, height 5' 11.1" (1.806 m), weight 164 lb 12.8 oz (74.8 kg), SpO2 97 %.  General: Alert, interactive, in no acute distress. HEENT: TMs pearly gray, turbinates moderately edematous with thick discharge, post-pharynx markedly erythematous. Neck: Supple without lymphadenopathy. Lungs: Clear to auscultation without wheezing, rhonchi or rales. CV: Normal S1, S2 without murmurs. Abdomen: Nondistended, nontender. Skin: Dry, erythematous, excoriated patches on the antecubital fossae, wrists, and popliteal fossae. Extremities:  No clubbing, cyanosis or edema. Neuro:   Grossly intact.  Review of systems:  Review of systems negative except as noted in HPI / PMHx or noted below: Review of Systems  Constitutional: Negative.   HENT: Negative.   Eyes: Negative.   Respiratory: Negative.   Cardiovascular: Negative.   Gastrointestinal: Negative.   Genitourinary: Negative.   Musculoskeletal: Negative.   Skin: Negative.   Neurological: Negative.   Endo/Heme/Allergies: Negative.   Psychiatric/Behavioral: Negative.     Past medical history:  Past Medical History:  Diagnosis Date  . Eczema     Past surgical history:  Past Surgical History:  Procedure Laterality Date  . TONSILLECTOMY    . WISDOM TOOTH EXTRACTION    . WRIST SURGERY      Family history: Family History  Problem Relation Age of Onset  . Allergic rhinitis Neg Hx   . Angioedema Neg Hx   . Asthma Neg Hx   . Eczema Neg Hx   . Immunodeficiency Neg Hx   . Urticaria Neg Hx     Social history: Social History   Socioeconomic History  . Marital status: Single    Spouse name: Not on file  .  Number of children: Not on file  . Years of education: Not on file  . Highest education level: Not on file  Social Needs  . Financial resource strain: Not on file  . Food insecurity - worry: Not on file  . Food insecurity - inability: Not on file  . Transportation needs - medical: Not on file  . Transportation needs - non-medical: Not on file  Occupational History  . Not on file  Tobacco Use  . Smoking status: Never Smoker  . Smokeless tobacco: Current User    Types: Snuff  Substance and Sexual Activity  . Alcohol use: Yes  . Drug use: No  . Sexual activity: Not on file  Other Topics Concern  . Not on file  Social History Narrative  . Not on file   Environmental History: The patient lives in a 29 year old house with carpeting in the bedroom and central air/heat.  There is no known mold/water damage in the home.  There are 4 dogs and 1 cat in the home which have access to his bedroom.  He is a non-smoker.  Allergies as of 07/15/2017      Reactions   Poison Ivy Extract [poison Ivy Extract]  Medication List        Accurate as of 07/15/17  3:18 PM. Always use your most recent med list.          amphetamine-dextroamphetamine 20 MG 24 hr capsule Commonly known as:  ADDERALL XR Take 20 mg by mouth daily.   Crisaborole 2 % Oint Commonly known as:  EUCRISA Apply 1 application topically 2 (two) times daily as needed.   desonide 0.05 % ointment Commonly known as:  DESOWEN Apply to affected areas twice a day as needed to the face and neck avoiding the eyes   fluocinonide cream 0.05 % Commonly known as:  LIDEX Apply 1 application topically 2 (two) times daily.   fluticasone 0.05 % cream Commonly known as:  CUTIVATE Apply 1 application topically 2 (two) times daily.   fluticasone 50 MCG/ACT nasal spray Commonly known as:  FLONASE Place 1 spray into both nostrils daily.   mometasone 0.1 % ointment Commonly known as:  ELOCON Apply to affected areas daily as needed  below the neck avoiding th under arms and groin       Known medication allergies: Allergies  Allergen Reactions  . Poison Ivy Extract [Poison Ivy Extract]     I appreciate the opportunity to take part in Rashawd's care. Please do not hesitate to contact me with questions.  Sincerely,   R. Jorene Guest, MD

## 2017-07-15 NOTE — Assessment & Plan Note (Addendum)
   Appropriate skin care recommendations have been provided verbally and in written form.  Prescription has been provided for Eucrisa (crisaborole) 2% ointment twice a day to affected areas as needed.  A prescription has been provided for desonide 0.05% ointment sparingly to affected areas twice daily as needed to the face and/or neck. Care is to be taken to avoid the eyes.  A prescription has been provided for mometasone 0.1% ointment sparingly to affected areas daily as needed below the face and neck. Care is to be taken to avoid the axillae and groin area.  The patient has been asked to make note of any foods that trigger symptom flares.  Fingernails are to be kept trimmed.  Information has been provided regarding CLn BodyWash to reduce staph aureus colonization.  CLn BodyWash is ordered online however, if it is too expensive, information and instructions for diluted bleach baths have also been provided.  Information has been provided and discussed for Dupixent. The patient is interested in initiating this therapy. Paperwork will be submitted to insurance and a sample will be provided.

## 2017-08-31 ENCOUNTER — Ambulatory Visit (INDEPENDENT_AMBULATORY_CARE_PROVIDER_SITE_OTHER): Payer: 59

## 2017-08-31 ENCOUNTER — Other Ambulatory Visit: Payer: Self-pay

## 2017-08-31 DIAGNOSIS — L209 Atopic dermatitis, unspecified: Secondary | ICD-10-CM

## 2017-08-31 MED ORDER — EPINEPHRINE 0.3 MG/0.3ML IJ SOAJ: 0.3000 mg | Freq: Once | INTRAMUSCULAR | 1 refills | Status: AC

## 2017-08-31 NOTE — Progress Notes (Signed)
Immunotherapy   Patient Details  Name: [name] MRN: [id number] Date of Birth: 21-Jun-1991  08/31/2017  [name] started injections for Dupixent 600mg  Both doses admin in office.  Pt to come back in 14 days to self admin. Lot#[id number] 4-20. Following schedule: every 14 days  Frequency:every 14 days Epi-Pen:Epi-Pen Available  Consent signed and patient instructions given.   [name] 08/31/2017, 2:55 PM

## 2017-08-31 NOTE — Progress Notes (Deleted)
Pt here for first administration of Dupixent 600mg .  First dose admin by office staff then second dose to be admin by pt.  Pt will then admin at home only one syringe every 14 days. Consent signed.  Epipen RX today.  Lot# [id number] 4-20.

## 2017-09-14 ENCOUNTER — Ambulatory Visit: Payer: 59

## 2017-09-26 IMAGING — CR DG SHOULDER 2+V*L*
3 series · 3 of 3 positions shown · non-contrast
Comparison: None.

CLINICAL DATA: Left shoulder injury 3 weeks ago

EXAM:
LEFT SHOULDER - 2+ VIEW

[w shoulder grashey left]
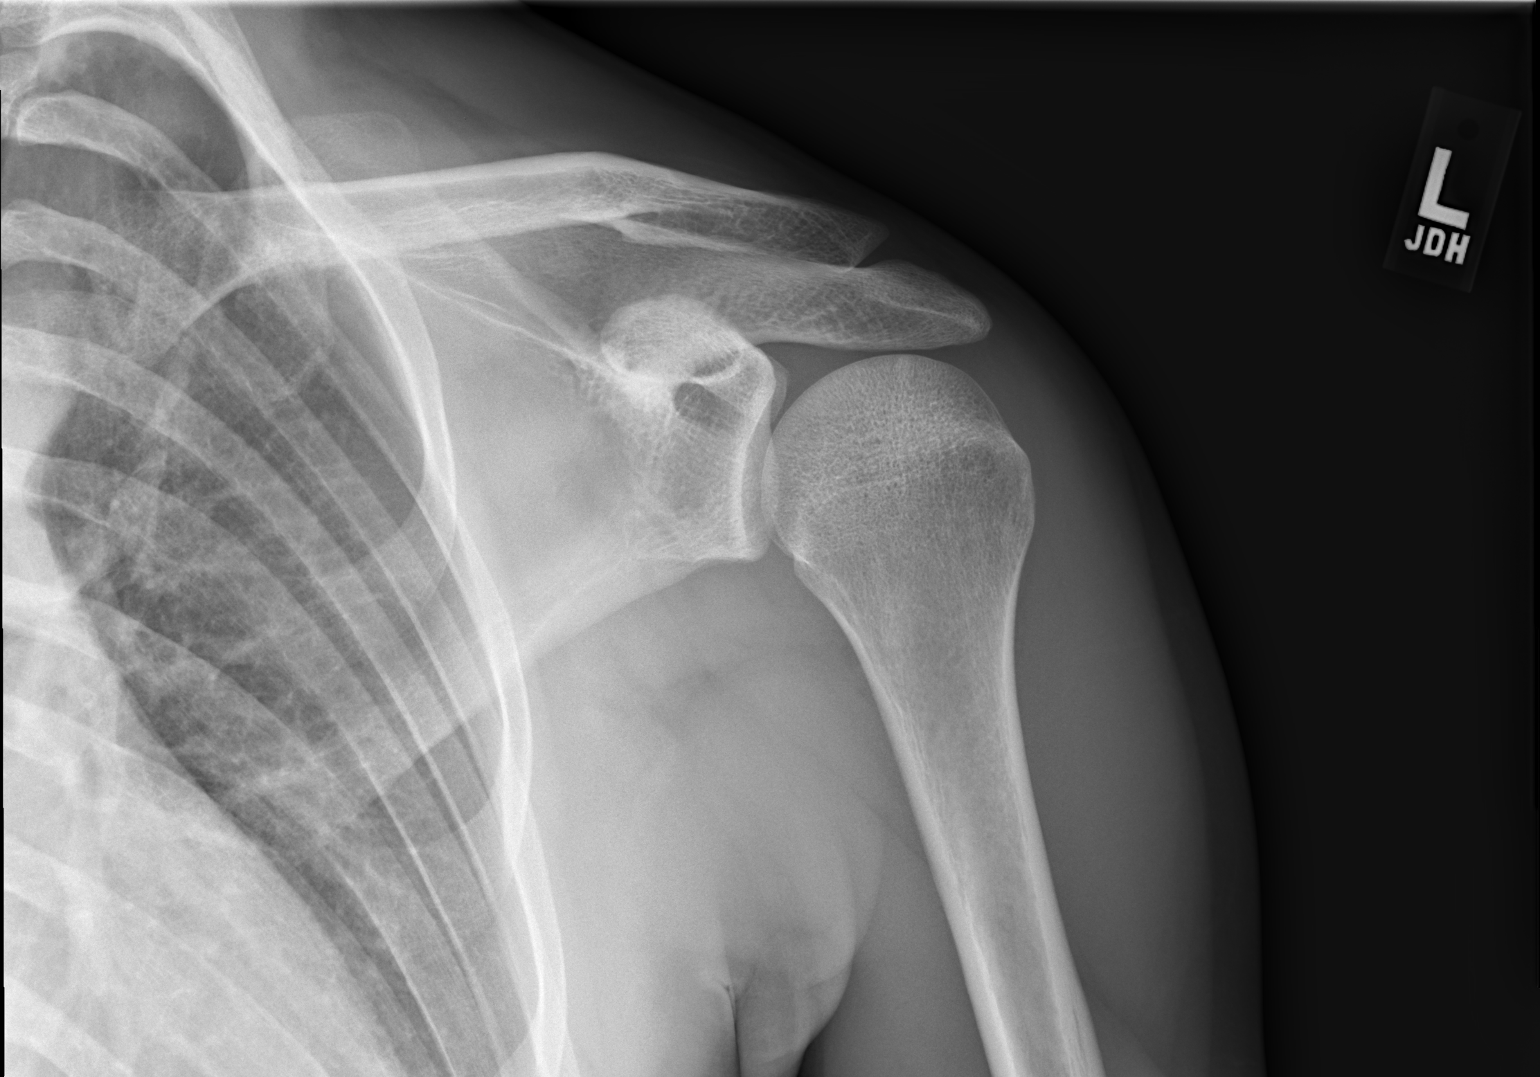

[w shoulder y-view left]
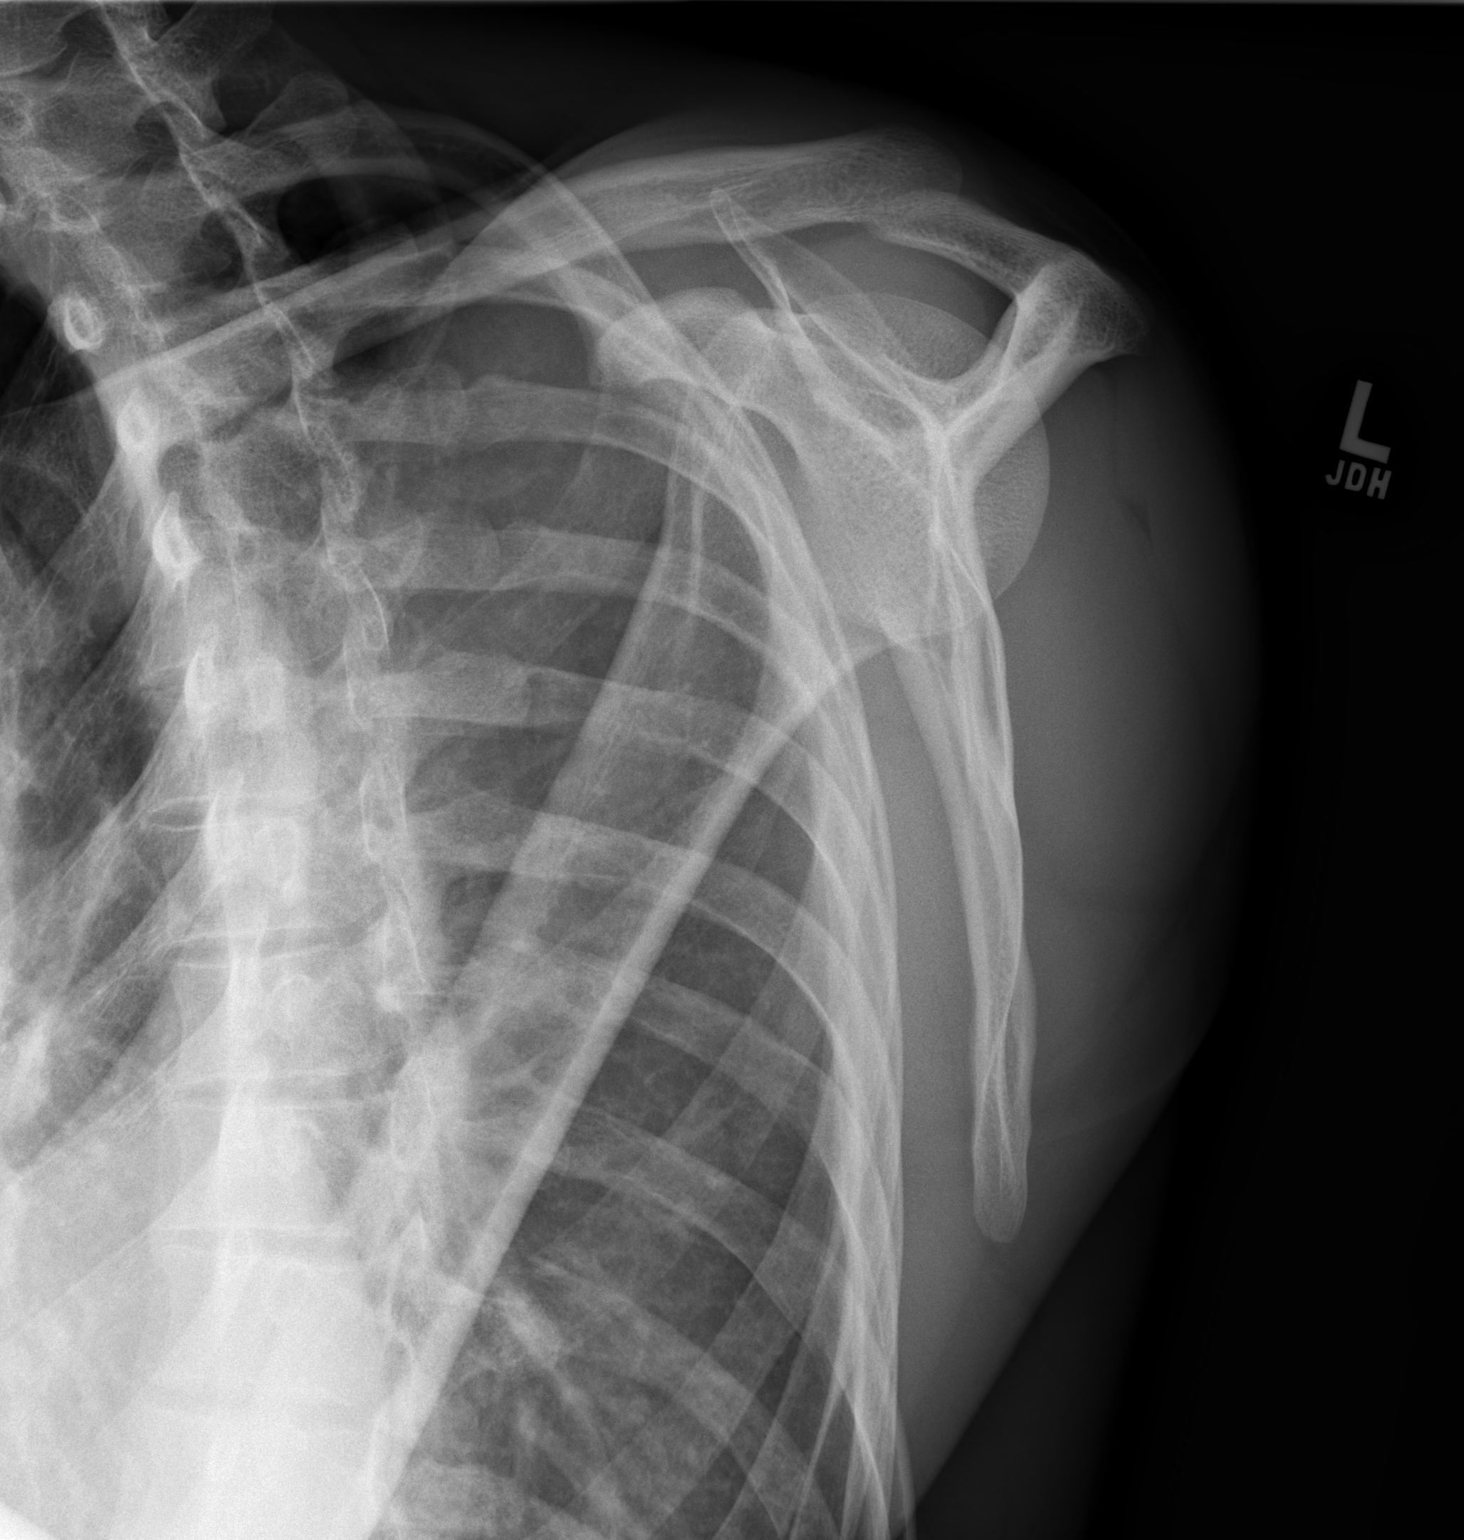

[w shoulder axillary left]
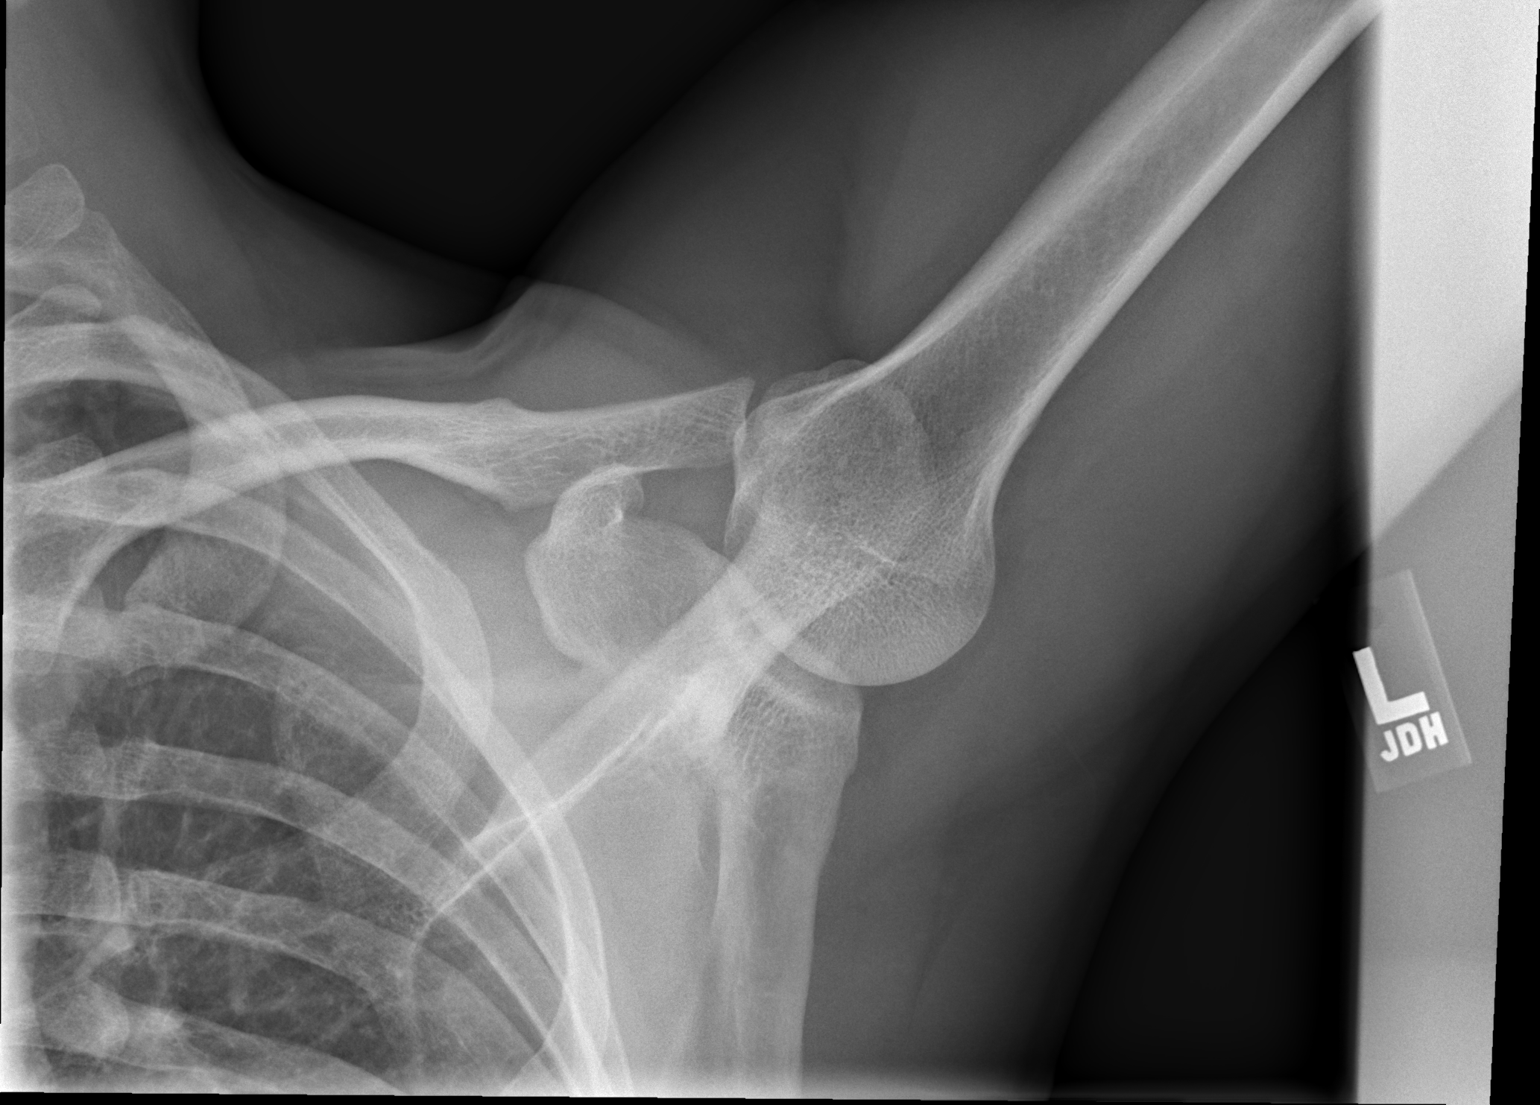

[3 of 3 positions shown; findings below may reference images not displayed]

FINDINGS: There is no evidence of fracture or dislocation. There is no
evidence of arthropathy or other focal bone abnormality. Soft
tissues are unremarkable.
IMPRESSION: Negative.

## 2017-10-14 ENCOUNTER — Ambulatory Visit: Payer: 59 | Admitting: Allergy and Immunology

## 2017-10-14 DIAGNOSIS — J309 Allergic rhinitis, unspecified: Secondary | ICD-10-CM

## 2019-02-22 ENCOUNTER — Ambulatory Visit: Payer: 59 | Admitting: Allergy and Immunology

## 2019-02-22 ENCOUNTER — Encounter: Payer: Self-pay | Admitting: Allergy and Immunology

## 2019-02-22 ENCOUNTER — Other Ambulatory Visit: Payer: Self-pay

## 2019-02-22 DIAGNOSIS — L2089 Other atopic dermatitis: Secondary | ICD-10-CM

## 2019-02-22 DIAGNOSIS — L209 Atopic dermatitis, unspecified: Secondary | ICD-10-CM | POA: Diagnosis not present

## 2019-02-22 DIAGNOSIS — J310 Chronic rhinitis: Secondary | ICD-10-CM

## 2019-02-22 MED ORDER — MOMETASONE FUROATE 0.1 % EX OINT: TOPICAL_OINTMENT | CUTANEOUS | 3 refills | Status: AC

## 2019-02-22 MED ORDER — DUPILUMAB 300 MG/2ML ~~LOC~~ SOSY
300.0000 mg | PREFILLED_SYRINGE | Freq: Once | SUBCUTANEOUS | Status: AC
  Administered 2019-02-22: 12:00:00 300 mg via SUBCUTANEOUS

## 2019-02-22 MED ORDER — EUCRISA 2 % EX OINT: 1.0000 "application " | TOPICAL_OINTMENT | Freq: Two times a day (BID) | CUTANEOUS | 5 refills | Status: AC | PRN

## 2019-02-22 MED ORDER — FLUTICASONE PROPIONATE 50 MCG/ACT NA SUSP: 1.0000 | Freq: Every day | NASAL | 5 refills | Status: DC

## 2019-02-22 NOTE — Patient Instructions (Addendum)
Atopic dermatitis Poorly controlled.  Restart Dupixent injections.  To hasten symptom relief, prednisone has been provided, 40 mg x3 days, 20 mg x1 day, 10 mg x1 day, then stop.  A prescription has been provided for mometasone 0.1% ointment sparingly to affected areas as needed.  This medication is not to be used on the face, neck, axillae, or groin area.  A prescription has been provided for Eucrisa (crisaborole) 2% ointment twice a day to affected areas as needed.  I have recommended  CLn body wash to decrease staph aureus colonization.  This body wash may be ordered online.  Chronic rhinitis  Continue fluticasone nasal spray if needed.  Nasal saline spray (i.e., Simply Saline) or nasal saline lavage (i.e., NeilMed) is recommended as needed and prior to medicated nasal sprays.  For thick post nasal drainage, add guaifenesin [phone] mg (Mucinex)  twice daily as needed with adequate hydration as discussed.   Return in about 6 months (around 08/25/2019), or if symptoms worsen or fail to improve.

## 2019-02-22 NOTE — Progress Notes (Signed)
Follow-up Note  RE: [name] MRN: [id number] DOB: 09/16/1990 Date of Office Visit: 02/22/2019  Primary care provider: Patient, No Pcp Per Referring provider: No ref. provider found  History of present illness: [name] is a 28 y.o. male with atopic dermatitis and chronic rhinitis presenting today for follow-up.  He was previously seen in this clinic for his initial evaluation in January 2019.  He reports that he had been receiving Dupixent injections for approximately 1 year.  He states that the Dupixent "was working some" regarding eczema suppression.  However, after 1 year of taking these injections he was no longer able to receive the injections for free and could not afford the medication therefore he discontinued.  His eczema has returned with severe involvement of the antecubital fossae, popliteal fossae, abdomen, and chest.  He received a steroid injection in an attempt to control the eczema.  He currently has insurance and would like to restart Dupixent, if possible.  He has no nasal allergy symptom complaints today.  Assessment and plan: Atopic dermatitis Poorly controlled.  Restart Dupixent injections.  To hasten symptom relief, prednisone has been provided, 40 mg x3 days, 20 mg x1 day, 10 mg x1 day, then stop.  A prescription has been provided for mometasone 0.1% ointment sparingly to affected areas as needed.  This medication is not to be used on the face, neck, axillae, or groin area.  A prescription has been provided for Eucrisa (crisaborole) 2% ointment twice a day to affected areas as needed.  I have recommended  CLn body wash to decrease staph aureus colonization.  This body wash may be ordered online.  Chronic rhinitis  Continue fluticasone nasal spray if needed.  Nasal saline spray (i.e., Simply Saline) or nasal saline lavage (i.e., NeilMed) is recommended as needed and prior to medicated nasal sprays.  For thick post nasal drainage, add  guaifenesin [phone] mg (Mucinex)  twice daily as needed with adequate hydration as discussed.   Meds ordered this encounter  Medications  . dupilumab (DUPIXENT) prefilled syringe 300 mg  . fluticasone (FLONASE) 50 MCG/ACT nasal spray    Sig: Place 1 spray into both nostrils daily.    Dispense:  18.2 g    Refill:  5  . Crisaborole (EUCRISA) 2 % OINT    Sig: Apply 1 application topically 2 (two) times daily as needed.    Dispense:  60 g    Refill:  5  . mometasone (ELOCON) 0.1 % ointment    Sig: Apply to affected areas daily as needed below the neck avoiding th under arms and groin    Dispense:  45 g    Refill:  3    Physical examination: Blood pressure 122/80, pulse 100, temperature 98.2 F (36.8 C), temperature source Oral, resp. rate 20, SpO2 95 %.  General: Alert, interactive, in no acute distress. HEENT: TMs pearly gray, turbinates moderately edematous without discharge, post-pharynx erythematous. Neck: Supple without lymphadenopathy. Lungs: Clear to auscultation without wheezing, rhonchi or rales. CV: Normal S1, S2 without murmurs. Skin: Dry, erythematous, excoriated patches on the antecubital fossae, popliteal fossae, abdomen, and chest..  The following portions of the patient's history were reviewed and updated as appropriate: allergies, current medications, past family history, past medical history, past social history, past surgical history and problem list.  Allergies as of 02/22/2019      Reactions   Poison Ivy Extract [poison Ivy Extract]       Medication List  Accurate as of February 22, 2019 12:38 PM. If you have any questions, ask your nurse or doctor.        amphetamine-dextroamphetamine 20 MG 24 hr capsule Commonly known as: ADDERALL XR Take 20 mg by mouth daily.   cetirizine 10 MG tablet Commonly known as: ZYRTEC Take 10 mg by mouth daily.   desonide 0.05 % ointment Commonly known as: DESOWEN Apply to affected areas twice a day as needed to  the face and neck avoiding the eyes   EPINEPHrine 0.3 mg/0.3 mL Soaj injection Commonly known as: EPI-PEN epinephrine 0.3 mg/0.3 mL injection, auto-injector   Eucrisa 2 % Oint Generic drug: Crisaborole Apply 1 application topically 2 (two) times daily as needed.   fluocinonide cream 0.05 % Commonly known as: LIDEX Apply 1 application topically 2 (two) times daily.   fluticasone 0.05 % cream Commonly known as: CUTIVATE Apply 1 application topically 2 (two) times daily.   fluticasone 50 MCG/ACT nasal spray Commonly known as: FLONASE Place 1 spray into both nostrils daily.   mometasone 0.1 % ointment Commonly known as: ELOCON Apply to affected areas daily as needed below the neck avoiding th under arms and groin       Allergies  Allergen Reactions  . Poison Ivy Extract [Poison Ivy Extract]    Review of systems: Review of systems negative except as noted in HPI / PMHx or noted below: Constitutional: Negative.  HENT: Negative.   Eyes: Negative.  Respiratory: Negative.   Cardiovascular: Negative.  Gastrointestinal: Negative.  Genitourinary: Negative.  Musculoskeletal: Negative.  Neurological: Negative.  Endo/Heme/Allergies: Negative.  Cutaneous: Negative.  Past Medical History:  Diagnosis Date  . Eczema     Family History  Problem Relation Age of Onset  . Allergic rhinitis Neg Hx   . Angioedema Neg Hx   . Asthma Neg Hx   . Eczema Neg Hx   . Immunodeficiency Neg Hx   . Urticaria Neg Hx     Social History   Socioeconomic History  . Marital status: Single    Spouse name: Not on file  . Number of children: Not on file  . Years of education: Not on file  . Highest education level: Not on file  Occupational History  . Not on file  Social Needs  . Financial resource strain: Not on file  . Food insecurity    Worry: Not on file    Inability: Not on file  . Transportation needs    Medical: Not on file    Non-medical: Not on file  Tobacco Use  . Smoking  status: Never Smoker  . Smokeless tobacco: Current User    Types: Snuff  Substance and Sexual Activity  . Alcohol use: Yes  . Drug use: No  . Sexual activity: Not on file  Lifestyle  . Physical activity    Days per week: Not on file    Minutes per session: Not on file  . Stress: Not on file  Relationships  . Social Musicianconnections    Talks on phone: Not on file    Gets together: Not on file    Attends religious service: Not on file    Active member of club or organization: Not on file    Attends meetings of clubs or organizations: Not on file    Relationship status: Not on file  . Intimate partner violence    Fear of current or ex partner: Not on file    Emotionally abused: Not on file  Physically abused: Not on file    Forced sexual activity: Not on file  Other Topics Concern  . Not on file  Social History Narrative  . Not on file    I appreciate the opportunity to take part in Aksh's care. Please do not hesitate to contact me with questions.  Sincerely,   R. Edgar Frisk, MD

## 2019-02-22 NOTE — Progress Notes (Signed)
Immunotherapy   Patient Details  Name: [name] MRN: [id number] Date of Birth: 08-25-1990  02/22/2019  [name] here to restart Dupixent (sample given) Loading dose given 300 mg by me and 300 mg by patient Frequency: Q 2 weeks Epi-Pen:Epi-Pen Available  Consent signed and patient instructions given.   Damita Gainey 02/22/2019, 11:54 AM

## 2019-02-22 NOTE — Assessment & Plan Note (Signed)
   Continue fluticasone nasal spray if needed.  Nasal saline spray (i.e., Simply Saline) or nasal saline lavage (i.e., NeilMed) is recommended as needed and prior to medicated nasal sprays.  For thick post nasal drainage, add guaifenesin 600-1200 mg (Mucinex)  twice daily as needed with adequate hydration as discussed. 

## 2019-02-22 NOTE — Assessment & Plan Note (Signed)
Poorly controlled.  Restart Dupixent injections.  To hasten symptom relief, prednisone has been provided, 40 mg x3 days, 20 mg x1 day, 10 mg x1 day, then stop.  A prescription has been provided for mometasone 0.1% ointment sparingly to affected areas as needed.  This medication is not to be used on the face, neck, axillae, or groin area.  A prescription has been provided for Eucrisa (crisaborole) 2% ointment twice a day to affected areas as needed.  I have recommended  CLn body wash to decrease staph aureus colonization.  This body wash may be ordered online.

## 2019-08-01 ENCOUNTER — Encounter: Payer: Self-pay | Admitting: Allergy and Immunology

## 2019-08-01 ENCOUNTER — Other Ambulatory Visit: Payer: Self-pay

## 2019-08-01 ENCOUNTER — Ambulatory Visit: Payer: 59 | Admitting: Allergy and Immunology

## 2019-08-01 DIAGNOSIS — J310 Chronic rhinitis: Secondary | ICD-10-CM | POA: Diagnosis not present

## 2019-08-01 DIAGNOSIS — L2089 Other atopic dermatitis: Secondary | ICD-10-CM

## 2019-08-01 NOTE — Patient Instructions (Addendum)
Atopic dermatitis Improved and well controlled.  Continue Dupixent injections every 2 weeks.  Continue mometasone 0.1% ointment sparingly to affected areas if needed.  Mometasone is not to be used on the face, neck, axillae, or groin.  May use Eucrisa ointment to mild areas and/your for maintenance.  Chronic rhinitis  Continue fluticasone nasal spray if needed.  Nasal saline spray (i.e., Simply Saline) or nasal saline lavage (i.e., NeilMed) is recommended as needed and prior to medicated nasal sprays.  For thick post nasal drainage, add guaifenesin [phone] mg (Mucinex)  twice daily as needed with adequate hydration as discussed.   Return in about 6 months (around 01/29/2020), or if symptoms worsen or fail to improve.

## 2019-08-01 NOTE — Progress Notes (Signed)
Follow-up Note  RE: [name] MRN: [id number] DOB: [birth year] Date of Office Visit: 08/01/2019  Primary care provider: Patient, No Pcp Per Referring provider: No ref. provider found  History of present illness: [name] is a 29 y.o. male with atopic dermatitis and chronic rhinitis returning today for follow up.  He was last seen in this clinic on February 22, 2019.  He reports significant reduction in atopic dermatitis while taking Dupixent injections every 2 weeks.  He has been self administering the injections without problems or complications.  He rarely requires topical medications, and only reports one mild flare which occurred due to an error in the shipping date of his Dupixent.  He reports that his nasal symptoms are well controlled with fluticasone nasal spray as needed.  He typically requires fluticasone nasal spray 1 time per week on average.  Has no new problems or complaints today.  Assessment and plan: Atopic dermatitis Improved and well controlled.  Continue Dupixent injections every 2 weeks.  Continue mometasone 0.1% ointment sparingly to affected areas if needed.  Mometasone is not to be used on the face, neck, axillae, or groin.  May use Eucrisa ointment to mild areas and/your for maintenance.  Chronic rhinitis  Continue fluticasone nasal spray if needed.  Nasal saline spray (i.e., Simply Saline) or nasal saline lavage (i.e., NeilMed) is recommended as needed and prior to medicated nasal sprays.  For thick post nasal drainage, add guaifenesin [id number] mg (Mucinex)  twice daily as needed with adequate hydration as discussed.   Physical examination: Blood pressure 122/86, pulse 88, temperature 97.9 F (36.6 C), temperature source Temporal, resp. rate 18, height 5' 11.1" (1.806 m), weight 190 lb (86.2 kg), SpO2 95 %.  General: Alert, interactive, in no acute distress. HEENT: TMs pearly gray, turbinates minimally edematous without discharge,  post-pharynx unremarkable. Neck: Supple without lymphadenopathy. Lungs: Clear to auscultation without wheezing, rhonchi or rales. CV: Normal S1, S2 without murmurs. Skin: Warm and dry, without lesions or rashes.  The following portions of the patient's history were reviewed and updated as appropriate: allergies, current medications, past family history, past medical history, past social history, past surgical history and problem list.  Current Outpatient Medications  Medication Sig Dispense Refill  . amphetamine-dextroamphetamine (ADDERALL XR) 20 MG 24 hr capsule Take 20 mg by mouth daily.    . cetirizine (ZYRTEC) 10 MG tablet Take 10 mg by mouth daily.    Lennox Solders (EUCRISA) 2 % OINT Apply 1 application topically 2 (two) times daily as needed. 60 g 5  . desonide (DESOWEN) 0.05 % ointment Apply to affected areas twice a day as needed to the face and neck avoiding the eyes 60 g 3  . DUPIXENT 300 MG/2ML prefilled syringe     . EPINEPHrine 0.3 mg/0.3 mL IJ SOAJ injection epinephrine 0.3 mg/0.3 mL injection, auto-injector    . fluocinonide cream (LIDEX) 0.05 % Apply 1 application topically 2 (two) times daily.    . fluticasone (CUTIVATE) 0.05 % cream Apply 1 application topically 2 (two) times daily.    . fluticasone (FLONASE) 50 MCG/ACT nasal spray Place 1 spray into both nostrils daily. 18.2 g 5  . mometasone (ELOCON) 0.1 % ointment Apply to affected areas daily as needed below the neck avoiding th under arms and groin 45 g 3   No current facility-administered medications for this visit.    Allergies  Allergen Reactions  . Poison Ivy Extract [Poison Ivy Extract]     I appreciate the  opportunity to take part in Hussien's care. Please do not hesitate to contact me with questions.  Sincerely,   R. Edgar Frisk, MD

## 2019-08-01 NOTE — Assessment & Plan Note (Signed)
Improved and well controlled.  Continue Dupixent injections every 2 weeks.  Continue mometasone 0.1% ointment sparingly to affected areas if needed.  Mometasone is not to be used on the face, neck, axillae, or groin.  May use Eucrisa ointment to mild areas and/your for maintenance.

## 2019-08-01 NOTE — Assessment & Plan Note (Signed)
   Continue fluticasone nasal spray if needed.  Nasal saline spray (i.e., Simply Saline) or nasal saline lavage (i.e., NeilMed) is recommended as needed and prior to medicated nasal sprays.  For thick post nasal drainage, add guaifenesin [id number] mg (Mucinex)  twice daily as needed with adequate hydration as discussed.

## 2019-08-23 ENCOUNTER — Ambulatory Visit: Payer: Self-pay | Admitting: Allergy and Immunology

## 2020-02-05 ENCOUNTER — Telehealth: Payer: Self-pay | Admitting: Allergy and Immunology

## 2020-02-05 ENCOUNTER — Other Ambulatory Visit: Payer: Self-pay | Admitting: *Deleted

## 2020-02-05 MED ORDER — DUPIXENT 300 MG/2ML ~~LOC~~ SOSY: 300.0000 mg | PREFILLED_SYRINGE | SUBCUTANEOUS | 11 refills | Status: DC

## 2020-02-05 NOTE — Telephone Encounter (Signed)
Patient states he is out of refills on Dupixent. Scheduled patient an appointment on 02/08/2020 in [place] with Thurston Hole. Can you verify patient needs an office visit in order to get refills on Dupixent?

## 2020-02-05 NOTE — Telephone Encounter (Signed)
He is due appt but I will go ahead and send refill in for him

## 2020-02-08 ENCOUNTER — Ambulatory Visit: Payer: 59 | Admitting: Family Medicine

## 2020-02-08 NOTE — Progress Notes (Deleted)
   [address] [place] [address] Dept: [phone]  FOLLOW UP NOTE  Patient ID: [name], male    DOB: 12-Apr-1991  Age: 29 y.o. MRN: [id number] Date of Office Visit: 02/08/2020  Assessment  Chief Complaint: No chief complaint on file.  HPI [name]    Drug Allergies:  Allergies  Allergen Reactions  . Poison Ivy Extract [Poison Ivy Extract]     Physical Exam: There were no vitals taken for this visit.   Physical Exam  Diagnostics:    Assessment and Plan: No diagnosis found.  No orders of the defined types were placed in this encounter.   There are no Patient Instructions on file for this visit.  No follow-ups on file.    Thank you for the opportunity to care for this patient.  Please do not hesitate to contact me with questions.  Thermon Leyland, FNP [facility]

## 2020-07-16 ENCOUNTER — Telehealth: Payer: Self-pay | Admitting: *Deleted

## 2020-07-16 NOTE — Telephone Encounter (Signed)
L/M for patient if still on Dupixent Ins requires re-approval and will need MD visit in order to obtain that approval. Advised to reach out to clinic to make appt

## 2023-10-21 ENCOUNTER — Encounter (HOSPITAL_BASED_OUTPATIENT_CLINIC_OR_DEPARTMENT_OTHER): Payer: Self-pay | Admitting: Orthopedic Surgery

## 2023-10-21 ENCOUNTER — Other Ambulatory Visit: Payer: Self-pay

## 2023-10-21 NOTE — H&P (Signed)
 PREOPERATIVE H&P  Chief Complaint: INFECTED OLECRANON BURSITIS, RIGHT  HPI: [name] is a 33 y.o. male who presents with a diagnosis of INFECTED OLECRANON BURSITIS, RIGHT. Symptoms are rated as moderate to severe, and have been worsening.  This is significantly impairing activities of daily living.  He has elected for surgical management.   Past Medical History:  Diagnosis Date   ADHD (attention deficit hyperactivity disorder)    Bursitis of elbow    Eczema    Past Surgical History:  Procedure Laterality Date   TONSILLECTOMY     WISDOM TOOTH EXTRACTION     WRIST SURGERY     Social History   Socioeconomic History   Marital status: Single    Spouse name: Not on file   Number of children: Not on file   Years of education: Not on file   Highest education level: Not on file  Occupational History   Not on file  Tobacco Use   Smoking status: Never   Smokeless tobacco: Former    Types: Snuff  Vaping Use   Vaping status: Never Used  Substance and Sexual Activity   Alcohol use: Yes   Drug use: No   Sexual activity: Not on file  Other Topics Concern   Not on file  Social History Narrative   Not on file   Social Drivers of Health   Financial Resource Strain: Not on file  Food Insecurity: Not on file  Transportation Needs: Not on file  Physical Activity: Not on file  Stress: Not on file  Social Connections: Unknown (11/04/2021)   Received from [facility]   Social Network    Social Network: Not on file   Family History  Problem Relation Age of Onset   Allergic rhinitis Neg Hx    Angioedema Neg Hx    Asthma Neg Hx    Eczema Neg Hx    Immunodeficiency Neg Hx    Urticaria Neg Hx    Allergies  Allergen Reactions   Poison Ivy Extract [Poison Ivy Extract]    Prior to Admission medications   Medication Sig Start Date End Date Taking? Authorizing Provider  cetirizine (ZYRTEC) 10 MG tablet Take 10 mg by mouth daily.   Yes [provider]   Crisaborole  (EUCRISA ) 2 % OINT Apply 1 application topically 2 (two) times daily as needed. 02/22/19  Yes Bobbitt, Colen Daunt, MD  desonide  (DESOWEN ) 0.05 % ointment Apply to affected areas twice a day as needed to the face and neck avoiding the eyes 07/15/17  Yes Bobbitt, Colen Daunt, MD  fluocinonide cream (LIDEX) 0.05 % Apply 1 application topically 2 (two) times daily.   Yes [provider]  fluticasone  (CUTIVATE ) 0.05 % cream Apply 1 application topically 2 (two) times daily.   Yes [provider]  mometasone  (ELOCON ) 0.1 % ointment Apply to affected areas daily as needed below the neck avoiding th under arms and groin 02/22/19  Yes Bobbitt, Colen Daunt, MD  EPINEPHrine  0.3 mg/0.3 mL IJ SOAJ injection epinephrine  0.3 mg/0.3 mL injection, auto-injector    [provider]     Positive ROS: All other systems have been reviewed and were otherwise negative with the exception of those mentioned in the HPI and as above.  Physical Exam: General: Alert, no acute distress Cardiovascular: No pedal edema Respiratory: No cyanosis, no use of accessory musculature GI: No organomegaly, abdomen is soft and non-tender Skin: No lesions in the area of chief complaint Neurologic: Sensation intact distally Psychiatric: Patient  is competent for consent with normal mood and affect Lymphatic: No axillary or cervical lymphadenopathy  MUSCULOSKELETAL: TTP right elbow, obvious edema to olecranon bursa, erythema present, warm to the touch, ROM limited due to pain, NVI   Imaging: n/a   Assessment: INFECTED OLECRANON BURSITIS, RIGHT  Plan: Plan for Procedure(s): IRRIGATION AND DEBRIDEMENT ELBOW BURSECTOMY, ELBOW  The risks benefits and alternatives were discussed with the patient including but not limited to the risks of nonoperative treatment, versus surgical intervention including infection, bleeding, nerve injury,  blood clots, cardiopulmonary complications, morbidity,  mortality, among others, and they were willing to proceed.   Weightbearing: Orthopedic devices: sling Showering: POD 3 Dressing: reinforce PRN Medicines: ASA, Oxy vs Norco, Mobic, Zofran  Discharge: home Follow up: 11/01/23 at 3:45pm    Ander Bame Office [phone] 10/21/2023 12:38 PM

## 2023-10-26 ENCOUNTER — Other Ambulatory Visit: Payer: Self-pay

## 2023-10-26 ENCOUNTER — Encounter (HOSPITAL_BASED_OUTPATIENT_CLINIC_OR_DEPARTMENT_OTHER)
Admission: RE | Disposition: A | Discharge status: Home / Self Care | Payer: Self-pay | Source: Home / Self Care | Attending: Orthopedic Surgery

## 2023-10-26 ENCOUNTER — Ambulatory Visit (HOSPITAL_BASED_OUTPATIENT_CLINIC_OR_DEPARTMENT_OTHER): Payer: MEDICAID | Admitting: Certified Registered"

## 2023-10-26 ENCOUNTER — Ambulatory Visit (HOSPITAL_BASED_OUTPATIENT_CLINIC_OR_DEPARTMENT_OTHER)
Admission: RE | Admit: 2023-10-26 | Discharge: 2023-10-26 | Disposition: A | Discharge status: Home / Self Care | Payer: MEDICAID | Attending: Orthopedic Surgery | Admitting: Orthopedic Surgery

## 2023-10-26 ENCOUNTER — Encounter (HOSPITAL_BASED_OUTPATIENT_CLINIC_OR_DEPARTMENT_OTHER): Payer: Self-pay | Admitting: Orthopedic Surgery

## 2023-10-26 DIAGNOSIS — M7021 Olecranon bursitis, right elbow: Secondary | ICD-10-CM | POA: Insufficient documentation

## 2023-10-26 DIAGNOSIS — M71121 Other infective bursitis, right elbow: Secondary | ICD-10-CM

## 2023-10-26 HISTORY — PX: OLECRANON BURSECTOMY: SHX2097

## 2023-10-26 HISTORY — DX: Other bursitis of elbow, unspecified elbow: M70.30

## 2023-10-26 HISTORY — PX: IRRIGATION AND DEBRIDEMENT ELBOW: SHX6886

## 2023-10-26 HISTORY — DX: Attention-deficit hyperactivity disorder, unspecified type: F90.9

## 2023-10-26 SURGERY — IRRIGATION AND DEBRIDEMENT ELBOW
Anesthesia: General | Site: Elbow | Laterality: Right

## 2023-10-26 MED ORDER — KETOROLAC TROMETHAMINE 30 MG/ML IJ SOLN
INTRAMUSCULAR | Status: AC
  Filled 2023-10-26: qty 3

## 2023-10-26 MED ORDER — BUPIVACAINE HCL (PF) 0.5 % IJ SOLN
INTRAMUSCULAR | Status: AC
  Filled 2023-10-26: qty 30

## 2023-10-26 MED ORDER — HYDROMORPHONE HCL 1 MG/ML IJ SOLN
INTRAMUSCULAR | Status: AC
  Filled 2023-10-26: qty 0.5

## 2023-10-26 MED ORDER — ROCURONIUM BROMIDE 10 MG/ML (PF) SYRINGE
PREFILLED_SYRINGE | INTRAVENOUS | Status: AC
  Filled 2023-10-26: qty 10

## 2023-10-26 MED ORDER — OXYCODONE HCL 5 MG PO TABS
5.0000 mg | ORAL_TABLET | Freq: Once | ORAL | Status: AC
  Administered 2023-10-26: 5 mg via ORAL

## 2023-10-26 MED ORDER — PROPOFOL 10 MG/ML IV BOLUS
INTRAVENOUS | Status: AC
  Filled 2023-10-26: qty 20

## 2023-10-26 MED ORDER — LIDOCAINE 2% (20 MG/ML) 5 ML SYRINGE
INTRAMUSCULAR | Status: DC | PRN
  Administered 2023-10-26: 60 mg via INTRAVENOUS

## 2023-10-26 MED ORDER — VANCOMYCIN HCL 500 MG IV SOLR
INTRAVENOUS | Status: AC
  Filled 2023-10-26: qty 10

## 2023-10-26 MED ORDER — FENTANYL CITRATE (PF) 100 MCG/2ML IJ SOLN
INTRAMUSCULAR | Status: AC
  Filled 2023-10-26: qty 2

## 2023-10-26 MED ORDER — FENTANYL CITRATE (PF) 100 MCG/2ML IJ SOLN
INTRAMUSCULAR | Status: DC | PRN
  Administered 2023-10-26: 50 ug via INTRAVENOUS
  Administered 2023-10-26: 100 ug via INTRAVENOUS
  Administered 2023-10-26: 50 ug via INTRAVENOUS

## 2023-10-26 MED ORDER — PROPOFOL 10 MG/ML IV BOLUS
INTRAVENOUS | Status: DC | PRN
  Administered 2023-10-26: 200 mg via INTRAVENOUS

## 2023-10-26 MED ORDER — DEXAMETHASONE SODIUM PHOSPHATE 10 MG/ML IJ SOLN
INTRAMUSCULAR | Status: DC | PRN
  Administered 2023-10-26: 10 mg via INTRAVENOUS

## 2023-10-26 MED ORDER — OXYCODONE HCL 5 MG PO TABS
ORAL_TABLET | ORAL | Status: AC
  Filled 2023-10-26: qty 1

## 2023-10-26 MED ORDER — SUGAMMADEX SODIUM 200 MG/2ML IV SOLN
INTRAVENOUS | Status: DC | PRN
  Administered 2023-10-26: 150 mg via INTRAVENOUS
  Administered 2023-10-26: 200 mg via INTRAVENOUS

## 2023-10-26 MED ORDER — VANCOMYCIN HCL 500 MG IV SOLR
INTRAVENOUS | Status: DC | PRN
  Administered 2023-10-26: 500 mg via TOPICAL

## 2023-10-26 MED ORDER — DEXAMETHASONE SODIUM PHOSPHATE 10 MG/ML IJ SOLN
INTRAMUSCULAR | Status: AC
  Filled 2023-10-26: qty 1

## 2023-10-26 MED ORDER — HYDROMORPHONE HCL 1 MG/ML IJ SOLN
0.2500 mg | INTRAMUSCULAR | Status: DC | PRN
  Administered 2023-10-26 (×3): 0.5 mg via INTRAVENOUS

## 2023-10-26 MED ORDER — DEXMEDETOMIDINE HCL IN NACL 80 MCG/20ML IV SOLN
INTRAVENOUS | Status: AC
  Filled 2023-10-26: qty 20

## 2023-10-26 MED ORDER — ONDANSETRON HCL 4 MG/2ML IJ SOLN
INTRAMUSCULAR | Status: AC
  Filled 2023-10-26: qty 2

## 2023-10-26 MED ORDER — ACETAMINOPHEN 500 MG PO TABS
1000.0000 mg | ORAL_TABLET | Freq: Once | ORAL | Status: AC
  Administered 2023-10-26: 1000 mg via ORAL

## 2023-10-26 MED ORDER — POVIDONE-IODINE 10 % EX SWAB
2.0000 | Freq: Once | CUTANEOUS | Status: AC
  Administered 2023-10-26: 2 via TOPICAL

## 2023-10-26 MED ORDER — ROCURONIUM BROMIDE 100 MG/10ML IV SOLN
INTRAVENOUS | Status: DC | PRN
  Administered 2023-10-26: 40 mg via INTRAVENOUS

## 2023-10-26 MED ORDER — ONDANSETRON 4 MG PO TBDP: 4.0000 mg | ORAL_TABLET | Freq: Three times a day (TID) | ORAL | 0 refills | Status: AC | PRN

## 2023-10-26 MED ORDER — LIDOCAINE 2% (20 MG/ML) 5 ML SYRINGE
INTRAMUSCULAR | Status: AC
  Filled 2023-10-26: qty 5

## 2023-10-26 MED ORDER — LACTATED RINGERS IV SOLN: INTRAVENOUS | Status: DC

## 2023-10-26 MED ORDER — DEXMEDETOMIDINE HCL IN NACL 80 MCG/20ML IV SOLN
INTRAVENOUS | Status: DC | PRN
  Administered 2023-10-26 (×2): 8 ug via INTRAVENOUS

## 2023-10-26 MED ORDER — MIDAZOLAM HCL 5 MG/5ML IJ SOLN
INTRAMUSCULAR | Status: DC | PRN
  Administered 2023-10-26: 2 mg via INTRAVENOUS

## 2023-10-26 MED ORDER — MIDAZOLAM HCL 2 MG/2ML IJ SOLN
INTRAMUSCULAR | Status: AC
  Filled 2023-10-26: qty 2

## 2023-10-26 MED ORDER — HYDROCODONE-ACETAMINOPHEN 10-325 MG PO TABS: 1.0000 | ORAL_TABLET | Freq: Four times a day (QID) | ORAL | 0 refills | Status: AC | PRN

## 2023-10-26 MED ORDER — ACETAMINOPHEN 500 MG PO TABS
ORAL_TABLET | ORAL | Status: AC
  Filled 2023-10-26: qty 2

## 2023-10-26 MED ORDER — ASPIRIN 81 MG PO TBEC: 81.0000 mg | DELAYED_RELEASE_TABLET | Freq: Two times a day (BID) | ORAL | 0 refills | Status: AC

## 2023-10-26 MED ORDER — ONDANSETRON HCL 4 MG/2ML IJ SOLN
INTRAMUSCULAR | Status: DC | PRN
  Administered 2023-10-26: 4 mg via INTRAVENOUS

## 2023-10-26 MED ORDER — CEFAZOLIN SODIUM-DEXTROSE 2-4 GM/100ML-% IV SOLN
2.0000 g | INTRAVENOUS | Status: AC
  Administered 2023-10-26: 2 g via INTRAVENOUS

## 2023-10-26 MED ORDER — KETOROLAC TROMETHAMINE 30 MG/ML IJ SOLN
INTRAMUSCULAR | Status: DC | PRN
  Administered 2023-10-26: 30 mg via INTRAVENOUS

## 2023-10-26 MED ORDER — MELOXICAM 15 MG PO TABS: 15.0000 mg | ORAL_TABLET | Freq: Every day | ORAL | 0 refills | Status: AC | PRN

## 2023-10-26 MED ORDER — CEFAZOLIN SODIUM-DEXTROSE 2-4 GM/100ML-% IV SOLN
INTRAVENOUS | Status: AC
  Filled 2023-10-26: qty 100

## 2023-10-26 SURGICAL SUPPLY — 53 items
BLADE SURG 15 STRL LF DISP TIS (BLADE) ×4 IMPLANT
BNDG COHESIVE 4X5 TAN STRL LF (GAUZE/BANDAGES/DRESSINGS) ×2 IMPLANT
BNDG ELASTIC 4INX 5YD STR LF (GAUZE/BANDAGES/DRESSINGS) ×2 IMPLANT
BNDG ELASTIC 6X10 VLCR STRL LF (GAUZE/BANDAGES/DRESSINGS) IMPLANT
CHLORAPREP W/TINT 26 (MISCELLANEOUS) ×2 IMPLANT
CLSR STERI-STRIP ANTIMIC 1/2X4 (GAUZE/BANDAGES/DRESSINGS) IMPLANT
COVER BACK TABLE 60X90IN (DRAPES) ×2 IMPLANT
CUFF TOURN SGL QUICK 18 NS (TOURNIQUET CUFF) ×2 IMPLANT
DRAIN PENROSE .5X12 LATEX STL (DRAIN) IMPLANT
DRAPE EXTREMITY T 121X128X90 (DISPOSABLE) ×2 IMPLANT
DRAPE IMP U-DRAPE 54X76 (DRAPES) ×2 IMPLANT
DRAPE INCISE IOBAN 66X45 STRL (DRAPES) IMPLANT
DRAPE U-SHAPE 47X51 STRL (DRAPES) ×2 IMPLANT
DRSG EMULSION OIL 3X3 NADH (GAUZE/BANDAGES/DRESSINGS) ×2 IMPLANT
ELECTRODE REM PT RTRN 9FT ADLT (ELECTROSURGICAL) ×2 IMPLANT
GAUZE PAD ABD 8X10 STRL (GAUZE/BANDAGES/DRESSINGS) ×2 IMPLANT
GAUZE SPONGE 4X4 12PLY STRL (GAUZE/BANDAGES/DRESSINGS) ×2 IMPLANT
GAUZE SPONGE 4X4 8PLY NS (GAUZE/BANDAGES/DRESSINGS) IMPLANT
GLOVE BIO SURGEON STRL SZ7.5 (GLOVE) ×2 IMPLANT
GLOVE BIOGEL PI IND STRL 7.5 (GLOVE) ×2 IMPLANT
GLOVE BIOGEL PI IND STRL 8 (GLOVE) ×2 IMPLANT
GLOVE SURG SYN 7.5 E (GLOVE) ×2 IMPLANT
GLOVE SURG SYN 7.5 PF PI (GLOVE) ×2 IMPLANT
GOWN STRL REUS W/ TWL LRG LVL3 (GOWN DISPOSABLE) ×4 IMPLANT
GOWN STRL REUS W/ TWL XL LVL3 (GOWN DISPOSABLE) ×2 IMPLANT
MANIFOLD NEPTUNE II (INSTRUMENTS) IMPLANT
NDL HYPO 22X1.5 SAFETY MO (MISCELLANEOUS) IMPLANT
NEEDLE HYPO 22X1.5 SAFETY MO (MISCELLANEOUS) IMPLANT
NS IRRIG 1000ML POUR BTL (IV SOLUTION) ×2 IMPLANT
PACK BASIN DAY SURGERY FS (CUSTOM PROCEDURE TRAY) ×2 IMPLANT
PAD CAST 4YDX4 CTTN HI CHSV (CAST SUPPLIES) ×2 IMPLANT
PENCIL SMOKE EVACUATOR (MISCELLANEOUS) ×2 IMPLANT
SET IRRIG Y TYPE TUR BLADDER L (SET/KITS/TRAYS/PACK) IMPLANT
SLEEVE SCD COMPRESS KNEE MED (STOCKING) IMPLANT
SLING ARM FOAM STRAP LRG (SOFTGOODS) IMPLANT
SPIKE FLUID TRANSFER (MISCELLANEOUS) IMPLANT
SPONGE T-LAP 18X18 ~~LOC~~+RFID (SPONGE) ×2 IMPLANT
SPONGE T-LAP 4X18 ~~LOC~~+RFID (SPONGE) IMPLANT
STOCKINETTE IMPERVIOUS LG (DRAPES) ×2 IMPLANT
SUCTION TUBE FRAZIER 10FR DISP (SUCTIONS) IMPLANT
SUT ETHILON 3 0 PS 1 (SUTURE) IMPLANT
SUT MON AB 2-0 CT1 36 (SUTURE) IMPLANT
SUT MON AB 4-0 PC3 18 (SUTURE) IMPLANT
SUT VIC AB 2-0 SH 27XBRD (SUTURE) IMPLANT
SUT VICRYL 0 SH 27 (SUTURE) IMPLANT
SWAB COLLECTION DEVICE MRSA (MISCELLANEOUS) IMPLANT
SWAB CULTURE ESWAB REG 1ML (MISCELLANEOUS) IMPLANT
SYR BULB EAR ULCER 3OZ GRN STR (SYRINGE) ×2 IMPLANT
SYR CONTROL 10ML LL (SYRINGE) IMPLANT
TOWEL GREEN STERILE FF (TOWEL DISPOSABLE) ×4 IMPLANT
TUBE CONNECTING 20X1/4 (TUBING) ×2 IMPLANT
UNDERPAD 30X36 HEAVY ABSORB (UNDERPADS AND DIAPERS) ×2 IMPLANT
YANKAUER SUCT BULB TIP NO VENT (SUCTIONS) ×2 IMPLANT

## 2023-10-26 NOTE — Op Note (Signed)
 10/26/2023  10:41 AM  PATIENT:  [name]    PRE-OPERATIVE DIAGNOSIS:  INFECTED OLECRANON BURSITIS, RIGHT  POST-OPERATIVE DIAGNOSIS:  Same  PROCEDURE:  IRRIGATION AND DEBRIDEMENT ELBOW, BURSECTOMY, ELBOW  SURGEON:  Saundra Curl, MD  ASSISTANT: Marzella Solan, PA-C, he was present and scrubbed throughout the case, critical for completion in a timely fashion, and for retraction, instrumentation, and closure.   ANESTHESIA:   General  PREOPERATIVE INDICATIONS:  [name] is a  33 y.o. male with a diagnosis of INFECTED OLECRANON BURSITIS, RIGHT who failed conservative measures and elected for surgical management.    The risks benefits and alternatives were discussed with the patient preoperatively including but not limited to the risks of infection, bleeding, nerve injury, cardiopulmonary complications, the need for revision surgery, among others, and the patient was willing to proceed.  OPERATIVE IMPLANTS: None  OPERATIVE FINDINGS: Purulent collection olecranon bursitis  BLOOD LOSS: Minimal  COMPLICATIONS: None  TOURNIQUET TIME: 30 minutes  OPERATIVE PROCEDURE:  Patient was identified in the preoperative holding area and site was marked by me He was transported to the operating theater and placed on the table in supine position taking care to pad all bony prominences. After a preincinduction time out anesthesia was induced. The right upper extremity was prepped and draped in normal sterile fashion and a pre-incision timeout was performed. He received Ancef after culture for preoperative antibiotics.   Made a longitudinal incision over his olecranon bursal abscess expressed all purulent fluid I collected some necrotic bursal tissue and purulence to send for culture  Next I performed a complete bursectomy at his olecranon bursa using a rondure.  I thoroughly irrigated his wound was happy with the remaining viable tissue  I placed vancomycin powder and closed his  wound along with his incision I placed 1 stitch to keep some compression on the olecranon bursa for stable healing  POST OPERATIVE PLAN: Sling full-time continue antibiotics p.o.

## 2023-10-26 NOTE — Anesthesia Procedure Notes (Signed)
 Procedure Name: Intubation Date/Time: 10/26/2023 8:17 AM  Performed by: Noralyn Beams, CRNAPre-anesthesia Checklist: Patient identified, Emergency Drugs available, Suction available and Patient being monitored Patient Re-evaluated:Patient Re-evaluated prior to induction Oxygen Delivery Method: Circle system utilized Preoxygenation: Pre-oxygenation with 100% oxygen Induction Type: IV induction Ventilation: Mask ventilation without difficulty Laryngoscope Size: Mac and 4 Grade View: Grade I Tube type: Oral Tube size: 7.5 mm Number of attempts: 1 Airway Equipment and Method: Stylet and Bite block Placement Confirmation: ETT inserted through vocal cords under direct vision, positive ETCO2 and breath sounds checked- equal and bilateral Secured at: 24 cm Tube secured with: Tape Dental Injury: Teeth and Oropharynx as per pre-operative assessment

## 2023-10-26 NOTE — Anesthesia Postprocedure Evaluation (Signed)
 Anesthesia Post Note  Patient: [name]  Procedure(s) Performed: IRRIGATION AND DEBRIDEMENT ELBOW (Right: Arm Upper) BURSECTOMY, ELBOW (Right: Elbow)     Patient location during evaluation: PACU Anesthesia Type: General Level of consciousness: awake and alert Pain management: pain level controlled Vital Signs Assessment: post-procedure vital signs reviewed and stable Respiratory status: spontaneous breathing, nonlabored ventilation and respiratory function stable Cardiovascular status: blood pressure returned to baseline and stable Postop Assessment: no apparent nausea or vomiting Anesthetic complications: no  No notable events documented.  Last Vitals:  Vitals:   10/26/23 0930 10/26/23 0950  BP: (!) 130/92 (!) 161/96  Pulse: 64 (!) 55  Resp: 18 18  Temp:  (!) 36.2 C  SpO2: 93% 98%    Last Pain:  Vitals:   10/26/23 0950  TempSrc: Temporal  PainSc: 5                  Margia Wiesen,W. EDMOND

## 2023-10-26 NOTE — Transfer of Care (Signed)
 Immediate Anesthesia Transfer of Care Note  Patient: [name]  Procedure(s) Performed: IRRIGATION AND DEBRIDEMENT ELBOW (Right: Arm Upper) BURSECTOMY, ELBOW (Right: Elbow)  Patient Location: PACU  Anesthesia Type:General  Level of Consciousness: awake, alert , and oriented  Airway & Oxygen Therapy: Patient Spontanous Breathing and Patient connected to face mask oxygen  Post-op Assessment: Report given to RN and Post -op Vital signs reviewed and stable  Post vital signs: Reviewed and stable  Last Vitals:  Vitals Value Taken Time  BP 110/99 10/26/23 0858  Temp    Pulse 66 10/26/23 0900  Resp 13 10/26/23 0900  SpO2 98 % 10/26/23 0900  Vitals shown include unfiled device data.  Last Pain:  Vitals:   10/26/23 0659  TempSrc: Temporal  PainSc: 10-Worst pain ever      Patients Stated Pain Goal: 6 (10/26/23 0659)  Complications: No notable events documented.

## 2023-10-26 NOTE — Discharge Instructions (Addendum)
 POST-OPERATIVE OPIOID TAPER INSTRUCTIONS: Next tylenol  dose after 2pm, Next dose of Ibuprofen 2:30pm  It is important to wean off of your opioid medication as soon as possible. If you do not need pain medication after your surgery it is ok to stop day one. Opioids include: Codeine, Hydrocodone (Norco, Vicodin), Oxycodone(Percocet, oxycontin) and hydromorphone amongst others.  Long term and even short term use of opiods can cause: Increased pain response Dependence Constipation Depression Respiratory depression And more.  Withdrawal symptoms can include Flu like symptoms Nausea, vomiting And more Techniques to manage these symptoms Hydrate well Eat regular healthy meals Stay active Use relaxation techniques(deep breathing, meditating, yoga) Do Not substitute Alcohol to help with tapering If you have been on opioids for less than two weeks and do not have pain than it is ok to stop all together.  Plan to wean off of opioids This plan should start within one week post op of your joint replacement. Maintain the same interval or time between taking each dose and first decrease the dose.  Cut the total daily intake of opioids by one tablet each day Next start to increase the time between doses. The last dose that should be eliminated is the evening dose.   Post Anesthesia Home Care Instructions  Activity: Get plenty of rest for the remainder of the day. A responsible individual must stay with you for 24 hours following the procedure.  For the next 24 hours, DO NOT: -Drive a car -Advertising copywriter -Drink alcoholic beverages -Take any medication unless instructed by your physician -Make any legal decisions or sign important papers.  Meals: Start with liquid foods such as gelatin or soup. Progress to regular foods as tolerated. Avoid greasy, spicy, heavy foods. If nausea and/or vomiting occur, drink only clear liquids until the nausea and/or vomiting subsides. Call your physician if  vomiting continues.  Special Instructions/Symptoms: Your throat may feel dry or sore from the anesthesia or the breathing tube placed in your throat during surgery. If this causes discomfort, gargle with warm salt water. The discomfort should disappear within 24 hours.  If you had a scopolamine patch placed behind your ear for the management of post- operative nausea and/or vomiting:  1. The medication in the patch is effective for 72 hours, after which it should be removed.  Wrap patch in a tissue and discard in the trash. Wash hands thoroughly with soap and water. 2. You may remove the patch earlier than 72 hours if you experience unpleasant side effects which may include dry mouth, dizziness or visual disturbances. 3. Avoid touching the patch. Wash your hands with soap and water after contact with the patch.    Post Anesthesia Home Care Instructions  Activity: Get plenty of rest for the remainder of the day. A responsible individual must stay with you for 24 hours following the procedure.  For the next 24 hours, DO NOT: -Drive a car -Advertising copywriter -Drink alcoholic beverages -Take any medication unless instructed by your physician -Make any legal decisions or sign important papers.  Meals: Start with liquid foods such as gelatin or soup. Progress to regular foods as tolerated. Avoid greasy, spicy, heavy foods. If nausea and/or vomiting occur, drink only clear liquids until the nausea and/or vomiting subsides. Call your physician if vomiting continues.  Special Instructions/Symptoms: Your throat may feel dry or sore from the anesthesia or the breathing tube placed in your throat during surgery. If this causes discomfort, gargle with warm salt water. The discomfort should disappear within 24  hours.  If you had a scopolamine patch placed behind your ear for the management of post- operative nausea and/or vomiting:  1. The medication in the patch is effective for 72 hours, after which  it should be removed.  Wrap patch in a tissue and discard in the trash. Wash hands thoroughly with soap and water. 2. You may remove the patch earlier than 72 hours if you experience unpleasant side effects which may include dry mouth, dizziness or visual disturbances. 3. Avoid touching the patch. Wash your hands with soap and water after contact with the patch.

## 2023-10-26 NOTE — Anesthesia Preprocedure Evaluation (Addendum)
 Anesthesia Evaluation  Patient identified by MRN, date of birth, ID band Patient awake    Reviewed: Allergy & Precautions, H&P , NPO status , Patient's Chart, lab work & pertinent test results  Airway Mallampati: II  TM Distance: >3 FB Neck ROM: Full    Dental no notable dental hx. (+) Teeth Intact, Dental Advisory Given   Pulmonary neg pulmonary ROS   Pulmonary exam normal breath sounds clear to auscultation       Cardiovascular negative cardio ROS  Rhythm:Regular Rate:Normal     Neuro/Psych negative neurological ROS  negative psych ROS   GI/Hepatic negative GI ROS, Neg liver ROS,,,  Endo/Other  negative endocrine ROS    Renal/GU negative Renal ROS  negative genitourinary   Musculoskeletal   Abdominal   Peds  (+) ADHD Hematology negative hematology ROS (+)   Anesthesia Other Findings   Reproductive/Obstetrics negative OB ROS                             Anesthesia Physical Anesthesia Plan  ASA: 2  Anesthesia Plan: General   Post-op Pain Management: Regional block*, Tylenol  PO (pre-op)* and Toradol IV (intra-op)*   Induction: Intravenous  PONV Risk Score and Plan: 3 and Ondansetron, Dexamethasone and Midazolam  Airway Management Planned: Oral ETT  Additional Equipment:   Intra-op Plan:   Post-operative Plan: Extubation in OR  Informed Consent: I have reviewed the patients History and Physical, chart, labs and discussed the procedure including the risks, benefits and alternatives for the proposed anesthesia with the patient or authorized representative who has indicated his/her understanding and acceptance.     Dental advisory given  Plan Discussed with: CRNA  Anesthesia Plan Comments:        Anesthesia Quick Evaluation

## 2023-10-26 NOTE — Interval H&P Note (Signed)
 History and Physical Interval Note:  10/26/2023 7:10 AM  [name]  has presented today for surgery, with the diagnosis of INFECTED OLECRANON BURSITIS, RIGHT.  The various methods of treatment have been discussed with the patient and family. After consideration of risks, benefits and other options for treatment, the patient has consented to  Procedure(s): IRRIGATION AND DEBRIDEMENT ELBOW (Right) BURSECTOMY, ELBOW (Right) as a surgical intervention.  The patient's history has been reviewed, patient examined, no change in status, stable for surgery.  I have reviewed the patient's chart and labs.  Questions were answered to the patient's satisfaction.     Saundra Curl

## 2023-10-31 LAB — AEROBIC/ANAEROBIC CULTURE W GRAM STAIN (SURGICAL/DEEP WOUND)
Culture: NO GROWTH
Gram Stain: NONE SEEN
